# Patient Record
Sex: Female | Born: 1965 | Race: Black or African American | Hispanic: No | Marital: Married | Smoking: Never smoker
Health system: Southern US, Community
[De-identification: ages and names within clinical notes are randomized; demographics above are authoritative.]

## PROBLEM LIST (undated history)

## (undated) DIAGNOSIS — R718 Other abnormality of red blood cells: Secondary | ICD-10-CM

## (undated) DIAGNOSIS — D649 Anemia, unspecified: Secondary | ICD-10-CM

## (undated) DIAGNOSIS — R87619 Unspecified abnormal cytological findings in specimens from cervix uteri: Secondary | ICD-10-CM

## (undated) DIAGNOSIS — D500 Iron deficiency anemia secondary to blood loss (chronic): Secondary | ICD-10-CM

## (undated) DIAGNOSIS — D219 Benign neoplasm of connective and other soft tissue, unspecified: Secondary | ICD-10-CM

## (undated) DIAGNOSIS — Z5189 Encounter for other specified aftercare: Secondary | ICD-10-CM

## (undated) DIAGNOSIS — R011 Cardiac murmur, unspecified: Secondary | ICD-10-CM

## (undated) DIAGNOSIS — I10 Essential (primary) hypertension: Secondary | ICD-10-CM

## (undated) HISTORY — DX: Encounter for other specified aftercare: Z51.89

## (undated) HISTORY — PX: MYOMECTOMY: SHX85

## (undated) HISTORY — DX: Essential (primary) hypertension: I10

## (undated) HISTORY — DX: Benign neoplasm of connective and other soft tissue, unspecified: D21.9

## (undated) HISTORY — DX: Iron deficiency anemia secondary to blood loss (chronic): D50.0

## (undated) HISTORY — DX: Anemia, unspecified: D64.9

## (undated) HISTORY — DX: Cardiac murmur, unspecified: R01.1

## (undated) HISTORY — DX: Other abnormality of red blood cells: R71.8

## (undated) HISTORY — DX: Unspecified abnormal cytological findings in specimens from cervix uteri: R87.619

---

## 2003-04-19 ENCOUNTER — Other Ambulatory Visit: Admission: RE | Admit: 2003-04-19 | Discharge: 2003-04-19 | Payer: Self-pay | Admitting: Family Medicine

## 2003-05-03 ENCOUNTER — Encounter: Payer: Self-pay | Admitting: Family Medicine

## 2003-05-03 ENCOUNTER — Encounter: Admission: RE | Admit: 2003-05-03 | Discharge: 2003-05-03 | Payer: Self-pay | Admitting: Family Medicine

## 2003-05-21 ENCOUNTER — Encounter (INDEPENDENT_AMBULATORY_CARE_PROVIDER_SITE_OTHER): Payer: Self-pay | Admitting: *Deleted

## 2003-05-21 ENCOUNTER — Ambulatory Visit (HOSPITAL_COMMUNITY): Admission: RE | Admit: 2003-05-21 | Discharge: 2003-05-21 | Payer: Self-pay | Admitting: Gastroenterology

## 2005-06-10 ENCOUNTER — Ambulatory Visit (HOSPITAL_COMMUNITY): Admission: RE | Admit: 2005-06-10 | Discharge: 2005-06-10 | Payer: Self-pay | Admitting: Obstetrics & Gynecology

## 2005-12-17 ENCOUNTER — Encounter (INDEPENDENT_AMBULATORY_CARE_PROVIDER_SITE_OTHER): Payer: Self-pay | Admitting: *Deleted

## 2005-12-17 ENCOUNTER — Inpatient Hospital Stay (HOSPITAL_COMMUNITY): Admission: RE | Admit: 2005-12-17 | Discharge: 2005-12-20 | Payer: Self-pay | Admitting: Obstetrics & Gynecology

## 2007-07-13 ENCOUNTER — Encounter: Admission: RE | Admit: 2007-07-13 | Discharge: 2007-07-13 | Payer: Self-pay | Admitting: Internal Medicine

## 2008-01-25 ENCOUNTER — Encounter: Admission: RE | Admit: 2008-01-25 | Discharge: 2008-01-25 | Payer: Self-pay | Admitting: Obstetrics and Gynecology

## 2008-02-03 ENCOUNTER — Encounter: Admission: RE | Admit: 2008-02-03 | Discharge: 2008-02-03 | Payer: Self-pay | Admitting: Obstetrics and Gynecology

## 2010-09-11 ENCOUNTER — Ambulatory Visit: Payer: Self-pay | Admitting: Internal Medicine

## 2010-09-11 ENCOUNTER — Emergency Department (HOSPITAL_COMMUNITY): Admission: EM | Admit: 2010-09-11 | Discharge: 2010-09-11 | Payer: Self-pay | Admitting: Emergency Medicine

## 2011-01-04 ENCOUNTER — Encounter: Payer: Self-pay | Admitting: Obstetrics and Gynecology

## 2011-02-26 LAB — DIFFERENTIAL
Basophils Absolute: 0.1 10*3/uL (ref 0.0–0.1)
Basophils Relative: 1 % (ref 0–1)
Eosinophils Relative: 2 % (ref 0–5)
Lymphocytes Relative: 27 % (ref 12–46)
Lymphs Abs: 2.3 10*3/uL (ref 0.7–4.0)
Monocytes Relative: 8 % (ref 3–12)
Neutro Abs: 5.1 10*3/uL (ref 1.7–7.7)

## 2011-02-26 LAB — BASIC METABOLIC PANEL
CO2: 25 mEq/L (ref 19–32)
Chloride: 108 mEq/L (ref 96–112)
GFR calc Af Amer: >60 mL/min (ref >60)
Potassium: 3.4 mEq/L — ABNORMAL LOW (ref 3.5–5.1)

## 2011-02-26 LAB — IRON AND TIBC: UIBC: 376 ug/dL

## 2011-02-26 LAB — CBC
HCT: 24 % — ABNORMAL LOW (ref 36.0–46.0)
Hemoglobin: 6.4 g/dL — CL (ref 12.0–15.0)
MCH: 14.5 pg — ABNORMAL LOW (ref 26.0–34.0)
MCV: 54.3 fL — ABNORMAL LOW (ref 78.0–100.0)
RBC: 4.42 MIL/uL (ref 3.87–5.11)
WBC: 8.4 10*3/uL (ref 4.0–10.5)

## 2011-02-26 LAB — ABO/RH: ABO/RH(D): O POS

## 2011-02-26 LAB — TYPE AND SCREEN
ABO/RH(D): O POS
Antibody Screen: NEGATIVE

## 2011-02-26 LAB — FOLATE: Folate: >20 ng/mL

## 2011-02-26 LAB — POCT CARDIAC MARKERS: Troponin i, poc: <0.05 ng/mL (ref 0.00–0.09)

## 2011-03-17 ENCOUNTER — Other Ambulatory Visit: Payer: Self-pay | Admitting: Nurse Practitioner

## 2011-03-18 ENCOUNTER — Other Ambulatory Visit: Payer: Self-pay | Admitting: Obstetrics and Gynecology

## 2011-03-18 DIAGNOSIS — Z1231 Encounter for screening mammogram for malignant neoplasm of breast: Secondary | ICD-10-CM

## 2011-03-21 ENCOUNTER — Other Ambulatory Visit: Payer: Self-pay | Admitting: Nurse Practitioner

## 2011-03-30 ENCOUNTER — Ambulatory Visit: Payer: Self-pay

## 2011-04-22 ENCOUNTER — Ambulatory Visit
Admission: RE | Admit: 2011-04-22 | Discharge: 2011-04-22 | Disposition: A | Discharge status: Home / Self Care | Payer: Commercial Indemnity | Source: Ambulatory Visit | Attending: Obstetrics and Gynecology | Admitting: Obstetrics and Gynecology

## 2011-04-22 DIAGNOSIS — Z1231 Encounter for screening mammogram for malignant neoplasm of breast: Secondary | ICD-10-CM

## 2011-05-01 NOTE — Op Note (Signed)
NAME:  Beth Jimenez, Beth Jimenez                            ACCOUNT NO.:  [id number]   MEDICAL RECORD NO.:  [id number]                   PATIENT TYPE:  AMB   LOCATION:  ENDO                                 FACILITY:  [facility]   PHYSICIAN:  Anselmo Rod, M.D.               DATE OF BIRTH:  25-Mar-1966   DATE OF PROCEDURE:  05/21/2003  DATE OF DISCHARGE:                                 OPERATIVE REPORT   PROCEDURE:  Esophagogastroduodenoscopy with biopsies.   ENDOSCOPIST:  Anselmo Rod, M.D.   INSTRUMENT USED:  Olympus video panendoscope.   INDICATIONS FOR PROCEDURE:  A 45 year old [place] American female with a  history of  iron deficiency anemia and other diseases not related to  gastritis, etc.   PRE PROCEDURE PREPARATION:  Informed consent was obtained from the patient.  The patient was  fasted for 8 hours prior to  the procedure. Pre procedure  physical, the patient had stable vital signs, neck supple, chest clear to  auscultation, S1, S2, abdomen soft with normal bowel sounds.   DESCRIPTION OF PROCEDURE:  The patient was placed in the left lateral  decubitus position and sedated with 50 mg of Demerol, 5 mg of Versed  intravenously. Once the patient was adequately sedated and maintained on low  flow oxygen and continuous cardiac monitoring, the Olympus video  panendoscope was advanced through the mouth piece over the tongue into the  esophagus under direct visualization.   The entire esophagus appeared normal with no evidence of rings, stricture,  masses, esophagitis or Barrett's mucosa. The scope was then advanced into  the stomach. Many ulcerations were seen in the mid body of the stomach.  These were biopsied to rule out H. pylori by pathology.  The rest of the  gastric mucosa including  the high cardia and the antrum appeared  normal.  There was no outlet obstruction. The duodenal bulb and the proximal small  bowel and distal bowel appeared normal.   IMPRESSION:  1. Many  ulcerations in the mid body, all the stomach biopsied to rule out H.     pylori.  2. Normal appearing esophagus and proximal small bowel.   RECOMMENDATIONS:  1. Await pathology results.  2. Avoid all nonsteroidals including aspirin.  3. Proton pump inhibitor of choice (I will give the patient some samples     from the office).  4.     Treat with antibiotics if H. pylori is present.  5. Proceed with colonoscopy at this time.                                                Anselmo Rod, M.D.    JNM/MEDQ  D:  05/21/2003  T:  05/21/2003  Job:  [id number]  cc:   Talmadge Coventry, M.D.  [address], [address]  [place]  [place] [address]  Fax: [phone]

## 2011-05-01 NOTE — H&P (Signed)
Beth Jimenez, Beth Jimenez                  ACCOUNT NO.:  [id number]   MEDICAL RECORD NO.:  [id number]          PATIENT TYPE:  AMB   LOCATION:  SDC                           [facility]   PHYSICIAN:  Roseanna Rainbow, M.D.DATE OF BIRTH:  02/07/1966   DATE OF ADMISSION:  DATE OF DISCHARGE:                                HISTORY & PHYSICAL   CHIEF COMPLAINT:  The patient is a 45 year old African American female with  uterine fibroids who presents for multiple abdominal myomectomies.   HISTORY OF PRESENT ILLNESS:  The patient has a long history of menorrhagia.  She has a remote history of a vaginal hemorrhage requiring a blood  transfusion.  Workup to date has included a pelvic ultrasound performed, on  June 10, 2005, that demonstrated a uterus that was at least 13.3-cm in  sagittal diameter.  There were three discrete uterine fibroids.  The largest  one was in the right upper uterine body measuring 8-cm in diameter.  This  fibroid was also noted to have a large submucosal component.  The second  fibroid was in the posterior uterine body and was 4-cm in diameter and there  was a third located in the left uterine body that measured 4-cm.  There was  no evidence of hydronephrosis.  An initial hemoglobin was in the 10 range,  repeat hemoglobin in October was 12.4.  A Pap smear, on June 11, 2005, was  within normal limits.  The patient has been treated medically with Depo-  Provera.   ALLERGIES:  No known drug allergies.   PAST MEDICAL HISTORY:  Anemia, please see the above.   PAST SURGICAL HISTORY:  Please see below.   SOCIAL HISTORY:  She is married, living with her spouse.  She is employed in  collections.  She has no significant smoking history, does not give any  significant history of alcohol usage.  Denies illicit drug use.   FAMILY HISTORY:  Chronic hypertension.   PAST GYNECOLOGIC HISTORY:  Please see the above.  She is status post a  bilateral tubal ligation.   PAST  OBSTETRICAL HISTORY:  She has been pregnant four times.  She has three  living children.  There is a history of one miscarriage or abortion.   REVIEW OF SYSTEMS:  Noncontributory.   MEDICATIONS:  Repliva  .   PHYSICAL EXAMINATION:  GENERAL:  Well-developed, well-nourished, African  American female in no apparent distress.  LUNGS:  Clear to auscultation bilaterally.  HEART:  Regular rate and rhythm.  ABDOMEN:  Normoactive bowel sounds.  There is a nontender mass arising from  the pelvis to the level of the umbilicus.  PELVIC:  The external female genitalia normal-appearing.  On speculum exam,  the vagina is clean.  On bimanual exam, the uterus is markedly enlarged  approximately 18 weeks aggregate size.   ASSESSMENT:  1.  Symptomatic leiomyomata of the uterus with secondary menorrhagia.  2.  Anemia.   PLAN:  The planned procedures are multiple abdominal myomectomies.  The  risks, benefits, and alternative forms of management were reviewed with the  patient and informed consent has been obtained.      Roseanna Rainbow, M.D.  Electronically Signed     LAJ/MEDQ  D:  12/16/2005  T:  12/16/2005  Job:  [id number]

## 2011-05-01 NOTE — Op Note (Signed)
   NAME:  Beth Jimenez, Beth Jimenez                            ACCOUNT NO.:  [id number]   MEDICAL RECORD NO.:  [id number]                   PATIENT TYPE:  AMB   LOCATION:  ENDO                                 FACILITY:  [facility]   PHYSICIAN:  Anselmo Rod, M.D.               DATE OF BIRTH:  May 12, 1966   DATE OF PROCEDURE:  05/21/2003  DATE OF DISCHARGE:                                 OPERATIVE REPORT   PROCEDURE:  Colonoscopy.   ENDOSCOPIST:  Anselmo Rod, M.D.   INSTRUMENT USED:  Olympus video colonoscope.   INDICATIONS FOR PROCEDURE:  Iron deficiency anemia in a 45 year old African-  American female, rule out colonic polyps, masses, etc.   PREPROCEDURE PREPARATION:  Informed consent was procured from the patient.  The patient fasted for eight hours prior to the procedure and prepped with a  bottle of magnesium citrate and a gallon of GoLYTELY the night prior to the  procedure.   PREPROCEDURE PHYSICAL:  The patient had stable vital signs. Neck supple.  Chest clear to auscultation. S1, S2 regular. Abdomen soft with normal bowel  sounds.   DESCRIPTION OF PROCEDURE:  The patient was placed in the left lateral  decubitus position and sedated with Demerol and Versed for the EGD, no  additional sedation was used for the colonoscopy. Once the patient was  adequately sedated and maintained on low flow oxygen and continuous cardiac  monitoring, the Olympus video colonoscope was advanced from the rectum to  the cecum without difficulty. The patient had a fairly good prep.  The  appendiceal orifice and ileocecal valve were clearly visualized and  photographed. No masses, polyps, erosions, ulcerations or diverticula were  seen. Retroflexion in the rectum revealed no abnormalities.   IMPRESSION:  Normal colonoscopy, no masses or polyps seen.   RECOMMENDATIONS:  1. Continue serial CBCs.  2. Continue iron supplementation as advised by Wyline Copas P.A.  3. Outpatient followup in the next two  weeks.  4. Agree with GYN evaluation (Dr. Peggye Pitt) with regards to dysfunctional     uterine bleeding, question of uterine fibroids.                                               Anselmo Rod, M.D.    JNM/MEDQ  D:  05/21/2003  T:  05/21/2003  Job:  [id number]   cc:   Annmarie Mazzocchi M.D.

## 2011-05-01 NOTE — Discharge Summary (Signed)
Beth Jimenez, Beth Jimenez                  ACCOUNT NO.:  [id number]   MEDICAL RECORD NO.:  [id number]          PATIENT TYPE:  INP   LOCATION:  9315                          [facility]   PHYSICIAN:  Charles A. Clearance Coots, M.D.DATE OF BIRTH:  04-Oct-1966   DATE OF ADMISSION:  12/17/2005  DATE OF DISCHARGE:  12/20/2005                                 DISCHARGE SUMMARY   ADMISSION DIAGNOSES:  1.  Uterine fibroids, symptomatic, with secondary menorrhagia.  2.  Anemia.   DISCHARGE DIAGNOSES:  1.  Uterine fibroids, symptomatic, with secondary menorrhagia.  2.  Anemia, stable clinically with no significant orthostatic changes.  The      patient did not require transfusion.  Anemia was from increased blood      loss surgically.  3.  Status post abdominal myomectomy.   DISPOSITION:  Discharge home in good condition.   REASON FOR ADMISSION:  A 45 year old black female with uterine fibroids,  presents for abdominal myomectomies.  The patient has a long history of  menorrhagia. She has a remote history of vaginal hemorrhage requiring a  blood transfusion.  Work-up to date has included a pelvic ultrasound  performed on June 10, 2005 that demonstrated a uterus that was at least 13  cm in sagittal diameter.  There were three discrete uterine fibroids.  The  largest one in the right upper uterine body measuring 8 cm in diameter.  Fibroid also noted to have large submucosal component.  A second fibroid was  in the posterior uterine body.  It was 4 cm in diameter and there was a  third located in the left uterine body measuring 4 cm.  An initial  hemoglobin was in the 10 range and repeat hemoglobin in October was 12.4.  Pap smear on June 11, 2005 was within normal limits.  The patient has been  treated medically with Depo-Provera to suppress menstrual periods.   PAST SURGICAL HISTORY:  Tubal ligation.   PAST MEDICAL HISTORY:  Anemia.   MEDICATIONS:  Iron.   ALLERGIES:  No known drug allergies.   SOCIAL HISTORY:  Married.  Living with spouse.  Employed in collections.  Has no significant smoking history and does not give any history of alcohol  usage or recreational drug use.   FAMILY HISTORY:  Significant for chronic hypertension.   OB HISTORY:  Gravida 4, para 3.   PHYSICAL EXAMINATION:  GENERAL:  Well-nourished, well-developed black female  in no acute distress.  VITAL SIGNS: Afebrile.  Vital signs are stable.  HEENT:  Normal.  LUNGS:  Clear to auscultation bilaterally.  HEART:  Regular rate and rhythm.  ABDOMEN:  Tender mass arising from the pelvis to the level of the umbilicus.  PELVIC:  Normal external female genitalia.  Vaginal mucosa clean.  Bimanual  exam:  The uterus is markedly enlarged, approximately 18 weeks in aggregate  size.   ADMITTING LABORATORY DATA:  Hemoglobin 11, hematocrit 33.9, white blood cell  count 6400, platelets 408,000.  Coags were within normal limits.  Comprehensive metabolic panel was within normal limits.  Urinalysis was  within normal  limits.   HOSPITAL COURSE:  The patient underwent abdominal myomectomies on December 17, 2005. There were no intraoperative complications.  Postoperative course was  uncomplicated.  The patient was discharged home on postoperative day #3 in  good condition.   DISCHARGE LABORATORY DATA:  Hemoglobin 6.9, hematocrit 21.5, white blood  cell count 9700, platelets 325,000.   DISCHARGE MEDICATIONS:  1.  Ibuprofen and Tylox were prescribed for pain.  2.  Continue iron therapy for anemia.   DISCHARGE INSTRUCTIONS:  Routine written instructions were given for  surgical discharge.  The patient is to call the office for followup  appointment in two weeks.      Charles A. Clearance Coots, M.D.  Electronically Signed     CAH/MEDQ  D:  12/20/2005  T:  12/21/2005  Job:  [id number]

## 2011-05-01 NOTE — Op Note (Signed)
Beth Jimenez, Beth Jimenez                  ACCOUNT NO.:  [id number]   MEDICAL RECORD NO.:  [id number]          PATIENT TYPE:  INP   LOCATION:  9399                          [facility]   PHYSICIAN:  Roseanna Rainbow, M.D.DATE OF BIRTH:  July 13, 1966   DATE OF PROCEDURE:  12/17/2005  DATE OF DISCHARGE:                                 OPERATIVE REPORT   PREOPERATIVE DIAGNOSIS:  Uterine fibroids.   POSTOPERATIVE DIAGNOSIS:  Uterine fibroids.   PROCEDURE:  Multiple abdominal myomectomy.   SURGEONS:  Roseanna Rainbow, M.D., Bing Neighbors. Clearance Coots, M.D.   ANESTHESIA:  General.   PATHOLOGY SPECIMENS:  Multiple myomas.   ESTIMATED BLOOD LOSS:  400 mL.   COMPLICATIONS:  None.   IV FLUID AND URINE OUTPUT:  As per anesthesiology.   PROCEDURE:  The patient was taken to the operating room with an IV running.  She was placed in the dorsal lithotomy position and prepped and draped in  the usual sterile fashion.  A Pfannenstiel skin incision was then made with  the scalpel and carried down to the underlying fascia with the Bovie.  The  fascia was nicked in the midline.  The fascial incision was then extended  bilaterally with curved Mayo scissors.  The superior aspect of the fascial  incision was tented up with Kocher clamps and the underlying rectus muscles  dissected off.  The inferior aspect of the fascial incision was manipulated  in a similar fashion.  The rectus muscles were separated in the midline.  The parietal peritoneum was tented up and entered.  The peritoneal incision  was then extended superiorly and inferiorly with good visualization of the  bladder.  The uterus was then exteriorized.  The myometrium in the midline  extending from the fundus to the lower uterine segment was infiltrated with  a dilute Pitressin solution.  The serosa and myometrium were incised down to  the underlying fibroids using the Bovie.  The fibroids were then enucleated.  This incision was then  extended inferiorly and to the left anteriorly to  facilitate the removal of an anterior myoma on the left.  Again the fibroid  was enucleated.  The endometrial cavity was entered.  Four myomas were  removed.  The defects in the myometrium were then reapproximated in layers  two running sutures, interlocking sutures of 2-0 Monocryl.  The serosa was  reapproximated in a running fashion using 2-0 Monocryl.  Adequate hemostasis  was noted.  The pelvis was copiously irrigated.  The uterus was returned to  the abdomen.  Seprafilm was placed along the serosal suture line.  The  parietal peritoneum was reapproximated in a running fashion with 2-0 Vicryl.  The  fascia was reapproximated in a running fashion using 0 PDS.  The skin was  reapproximated with staples.  At the close of the procedure, the instrument  and pack counts were said to be correct x2.  The patient was taken to the  PACU awake and in stable condition.      Roseanna Rainbow, M.D.  Electronically Signed  LAJ/MEDQ  D:  12/17/2005  T:  12/17/2005  Job:  [id number]

## 2011-08-21 IMAGING — CR DG CHEST 1V PORT
1 series · 1 of 1 positions shown · non-contrast
Comparison: None.

CLINICAL DATA: Chest pain.

PORTABLE CHEST - 1 VIEW

[view not recorded]
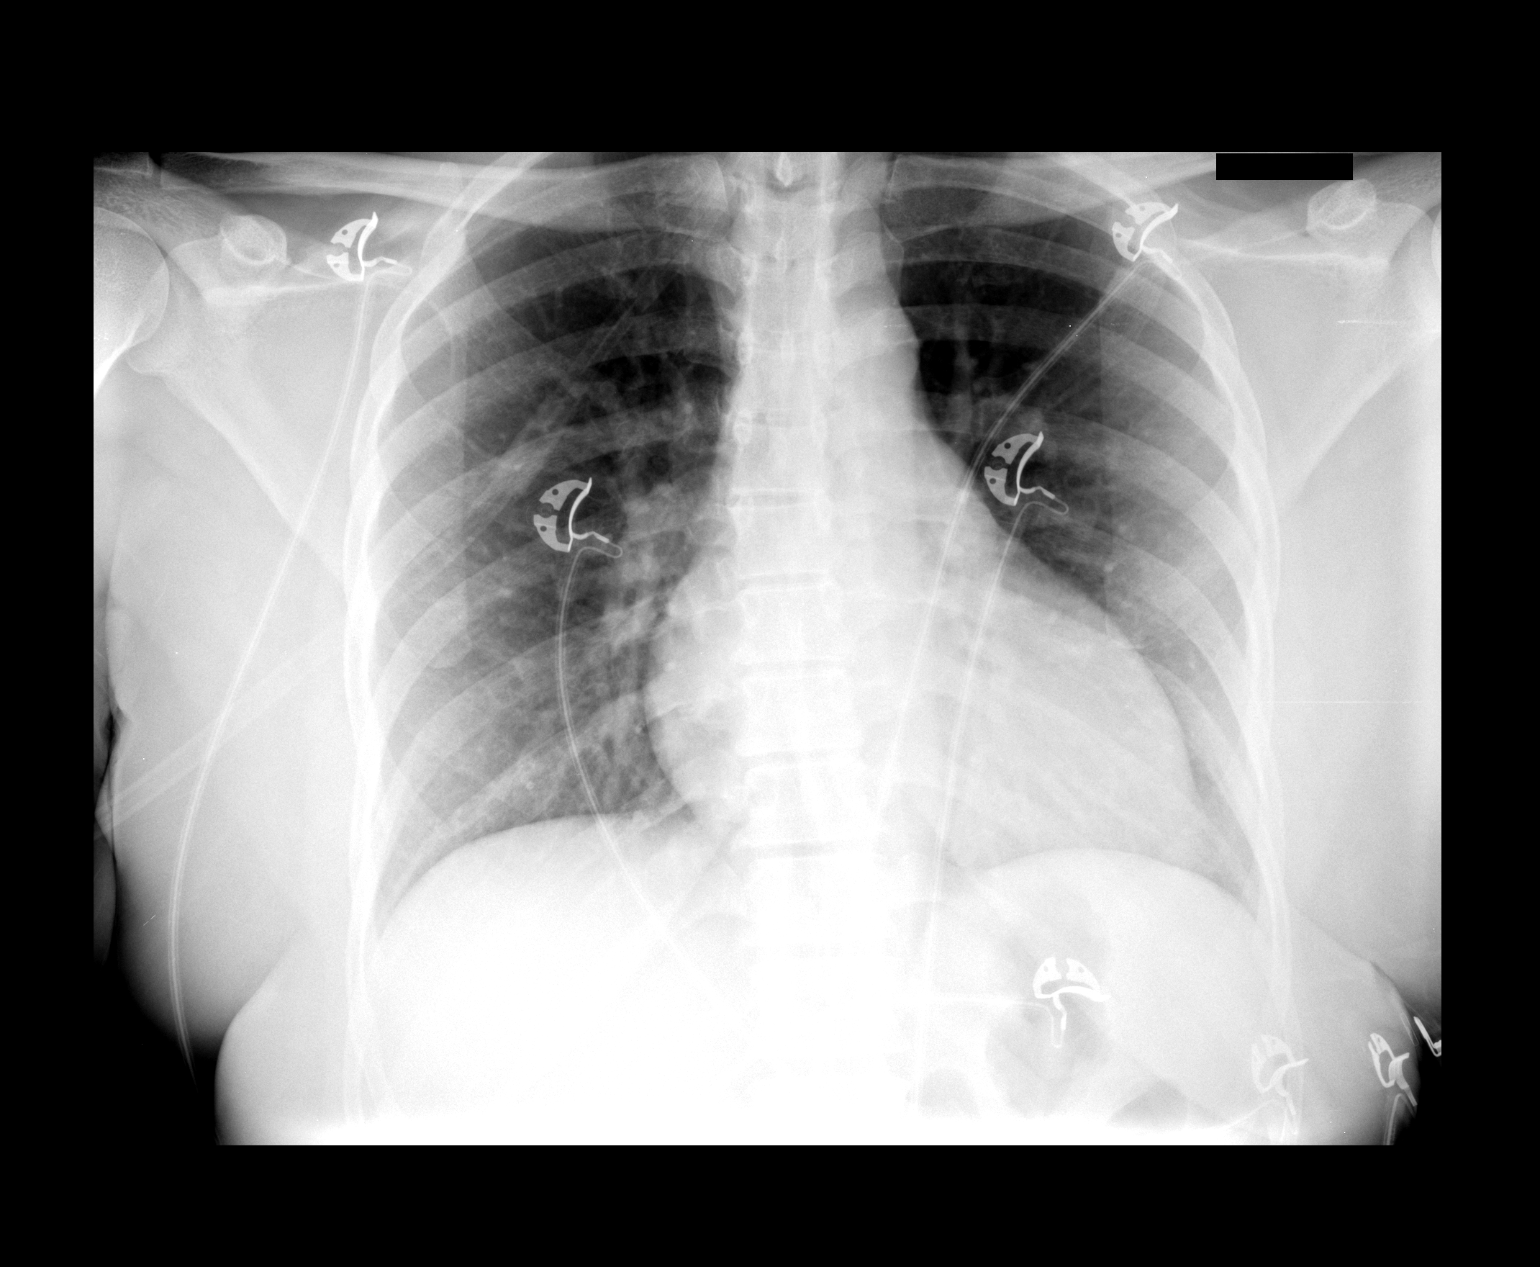

[1 of 1 positions shown; findings below may reference images not displayed]

FINDINGS: The lungs are clear.  Heart size is normal.  No pleural
effusion or focal bony abnormality.
IMPRESSION: No acute disease.

## 2012-04-25 ENCOUNTER — Other Ambulatory Visit: Payer: Self-pay | Admitting: Internal Medicine

## 2012-05-04 ENCOUNTER — Other Ambulatory Visit: Payer: Self-pay | Admitting: Internal Medicine

## 2012-05-25 ENCOUNTER — Other Ambulatory Visit: Payer: Self-pay | Admitting: Family Medicine

## 2012-05-25 DIAGNOSIS — Z1231 Encounter for screening mammogram for malignant neoplasm of breast: Secondary | ICD-10-CM

## 2012-06-06 ENCOUNTER — Ambulatory Visit
Admission: RE | Admit: 2012-06-06 | Discharge: 2012-06-06 | Disposition: A | Discharge status: Home / Self Care | Payer: Commercial Indemnity | Source: Ambulatory Visit | Attending: Family Medicine | Admitting: Family Medicine

## 2012-06-06 DIAGNOSIS — Z1231 Encounter for screening mammogram for malignant neoplasm of breast: Secondary | ICD-10-CM

## 2012-06-24 ENCOUNTER — Other Ambulatory Visit (HOSPITAL_COMMUNITY): Payer: Self-pay | Admitting: *Deleted

## 2012-06-27 ENCOUNTER — Encounter (HOSPITAL_COMMUNITY)
Admission: RE | Admit: 2012-06-27 | Discharge: 2012-06-27 | Disposition: A | Discharge status: Home / Self Care | Payer: Commercial Indemnity | Source: Ambulatory Visit | Attending: Family Medicine | Admitting: Family Medicine

## 2012-06-27 DIAGNOSIS — N920 Excessive and frequent menstruation with regular cycle: Secondary | ICD-10-CM | POA: Insufficient documentation

## 2012-06-27 DIAGNOSIS — D259 Leiomyoma of uterus, unspecified: Secondary | ICD-10-CM | POA: Insufficient documentation

## 2012-06-27 DIAGNOSIS — D62 Acute posthemorrhagic anemia: Secondary | ICD-10-CM | POA: Insufficient documentation

## 2012-06-27 MED ORDER — DIPHENHYDRAMINE HCL 50 MG/ML IJ SOLN
25.0000 mg | Freq: Once | INTRAMUSCULAR | Status: AC
  Administered 2012-06-27: 25 mg via INTRAVENOUS
  Filled 2012-06-27: qty 1

## 2012-06-27 MED ORDER — FUROSEMIDE 10 MG/ML IJ SOLN
20.0000 mg | Freq: Once | INTRAMUSCULAR | Status: AC
  Administered 2012-06-27: 20 mg via INTRAVENOUS
  Filled 2012-06-27 (×3): qty 2

## 2012-06-28 LAB — TYPE AND SCREEN
Antibody Screen: NEGATIVE
Unit division: 0

## 2012-12-19 ENCOUNTER — Other Ambulatory Visit: Payer: Self-pay | Admitting: Internal Medicine

## 2012-12-19 ENCOUNTER — Other Ambulatory Visit: Payer: Self-pay | Admitting: Family Medicine

## 2012-12-19 DIAGNOSIS — N6310 Unspecified lump in the right breast, unspecified quadrant: Secondary | ICD-10-CM

## 2012-12-23 ENCOUNTER — Ambulatory Visit
Admission: RE | Admit: 2012-12-23 | Discharge: 2012-12-23 | Disposition: A | Discharge status: Home / Self Care | Payer: Commercial Indemnity | Source: Ambulatory Visit | Attending: Family Medicine | Admitting: Family Medicine

## 2012-12-23 DIAGNOSIS — N6310 Unspecified lump in the right breast, unspecified quadrant: Secondary | ICD-10-CM

## 2013-06-29 ENCOUNTER — Other Ambulatory Visit: Payer: Self-pay

## 2013-06-29 DIAGNOSIS — Z1231 Encounter for screening mammogram for malignant neoplasm of breast: Secondary | ICD-10-CM

## 2013-07-24 ENCOUNTER — Ambulatory Visit: Payer: Commercial Indemnity

## 2013-09-28 ENCOUNTER — Other Ambulatory Visit: Payer: Self-pay

## 2013-09-28 DIAGNOSIS — Z1231 Encounter for screening mammogram for malignant neoplasm of breast: Secondary | ICD-10-CM

## 2013-10-03 ENCOUNTER — Encounter (HOSPITAL_COMMUNITY): Payer: Commercial Indemnity

## 2013-10-04 ENCOUNTER — Telehealth: Payer: Self-pay | Admitting: Oncology

## 2013-10-04 NOTE — Telephone Encounter (Signed)
LVOM FOR PT TO RETURN CALL IN RE TO REFERRAL.  °

## 2013-10-09 ENCOUNTER — Ambulatory Visit: Payer: Commercial Indemnity

## 2013-10-24 ENCOUNTER — Ambulatory Visit
Admission: RE | Admit: 2013-10-24 | Discharge: 2013-10-24 | Disposition: A | Discharge status: Home / Self Care | Payer: Managed Care, Other (non HMO) | Source: Ambulatory Visit

## 2013-10-24 DIAGNOSIS — Z1231 Encounter for screening mammogram for malignant neoplasm of breast: Secondary | ICD-10-CM

## 2014-06-27 ENCOUNTER — Telehealth: Payer: Self-pay | Admitting: Hematology and Oncology

## 2014-06-27 NOTE — Telephone Encounter (Signed)
LEFT  MESSAGE AND GAVE NP APPT FOR 07/21 @ 1:30 W/DR. [place].  REFERRING DR. [place] DX- ANEMIA

## 2014-06-27 NOTE — Telephone Encounter (Signed)
PATIENT CALLED TO CONFIRMED APPT.

## 2014-07-03 ENCOUNTER — Ambulatory Visit: Payer: Managed Care, Other (non HMO)

## 2014-07-03 ENCOUNTER — Encounter: Payer: Self-pay | Admitting: Hematology and Oncology

## 2014-07-03 ENCOUNTER — Encounter (INDEPENDENT_AMBULATORY_CARE_PROVIDER_SITE_OTHER): Payer: Self-pay

## 2014-07-03 ENCOUNTER — Ambulatory Visit (HOSPITAL_BASED_OUTPATIENT_CLINIC_OR_DEPARTMENT_OTHER): Payer: Managed Care, Other (non HMO) | Admitting: Hematology and Oncology

## 2014-07-03 ENCOUNTER — Telehealth: Payer: Self-pay | Admitting: Hematology and Oncology

## 2014-07-03 VITALS — BP 139/81 | HR 90 | Temp 98.6°F | Resp 18 | Ht 65.0 in | Wt 178.3 lb

## 2014-07-03 DIAGNOSIS — N920 Excessive and frequent menstruation with regular cycle: Secondary | ICD-10-CM | POA: Insufficient documentation

## 2014-07-03 DIAGNOSIS — D500 Iron deficiency anemia secondary to blood loss (chronic): Secondary | ICD-10-CM

## 2014-07-03 DIAGNOSIS — R799 Abnormal finding of blood chemistry, unspecified: Secondary | ICD-10-CM

## 2014-07-03 DIAGNOSIS — N921 Excessive and frequent menstruation with irregular cycle: Secondary | ICD-10-CM

## 2014-07-03 HISTORY — DX: Iron deficiency anemia secondary to blood loss (chronic): D50.0

## 2014-07-03 NOTE — Assessment & Plan Note (Signed)
The most likely cause of her anemia is due to chronic blood loss from menorrhagia. We discussed some of the risks, benefits, and alternatives of intravenous iron infusions. The patient is symptomatic from anemia and the iron level is critically low. She tolerated oral iron supplement poorly and desires to achieved higher levels of iron faster for adequate hematopoesis. Some of the side-effects to be expected including risks of infusion reactions, phlebitis, headaches, nausea and fatigue.  The patient is willing to proceed. Patient education material was dispensed.  Goal is to keep ferritin level greater than 50 I recommend high-dose folic acid supplementation. I will recheck her blood in a month and see her back. The patient is aware she may need a second dose of intravenous iron infusion.

## 2014-07-03 NOTE — Progress Notes (Signed)
Checked in new patient with no financial issues prior to seeing the dr. She has appt card and [place] been out of the country. °

## 2014-07-03 NOTE — Progress Notes (Signed)
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 female is here because of severe iron deficiency anemia.  She was found to have abnormal CBC from blood work. Her CBC from 09/28/2013 show hemoglobin of 7.8, MCV of 69.7. On 06/25/2014, hemoglobin was 8.5, MCV 66.3 and platelet count of 538. In 2011, she developed severe menorrhagia requiring blood transfusions. She has received blood transfusion twice, last transfusion was a year ago. She have chronic, severe menorrhagia with irregular menstruation over the last 5 months. She has been having menstrual cycles twice a month, average length of penetration 8-9 days with heavy periods over the first few days. She routinely misses work due to heavy menstruation. Hysterectomy iss planned. She had uterine fibroids status post myomectomy and history of abnormal Pap smears in the past. She takes one oral iron supplement daily after breakfast. She denies pica.  She denies recent chest pain on exertion, shortness of breath on minimal exertion, pre-syncopal episodes, or palpitations. She had not noticed any recent bleeding such as epistaxis, hematuria or hematochezia The patient denies over the counter NSAID ingestion. She is not on antiplatelets agents.  She had no prior history or diagnosis of cancer. Her age appropriate screening programs are up-to-date. She denies any pica and eats a variety of diet. She never donated blood. MEDICAL HISTORY:  Past Medical History  Diagnosis Date  . Blood transfusion without reported diagnosis   . Anemia   . Hypertension   . Fibroids   . Abnormal Pap smear of cervix   . Iron deficiency  anemia secondary to blood loss (chronic) 07/03/2014    SURGICAL HISTORY: Past Surgical History  Procedure Laterality Date  . Myomectomy      SOCIAL HISTORY: History   Social History  . Marital Status: Married    Spouse Name: N/A    Number of Children: N/A  . Years of Education: N/A   Occupational History  . Not on file.   Social History Main Topics  . Smoking status: Never Smoker   . Smokeless tobacco: Never Used  . Alcohol Use: No  . Drug Use: No  . Sexual Activity: Not on file   Other Topics Concern  . Not on file   Social History Narrative  . No narrative on file    FAMILY HISTORY: Family History  Problem Relation Age of Onset  . Cancer Mother     lung ca  . Cancer Maternal Grandmother     lung ca    ALLERGIES:  has no allergies on file.  MEDICATIONS:  Current Outpatient Prescriptions  Medication Sig Dispense Refill  . amLODipine (NORVASC) 10 MG tablet TAKE ONE TABLET BY MOUTH EVERY DAY  30 tablet  3  . IRON, FERROUS GLUCONATE, PO Take by mouth daily.      . Multiple Vitamin (MULTIVITAMIN) tablet Take 1 tablet by mouth daily.      Marland Kitchen OVER THE COUNTER MEDICATION Take by mouth. BURN- weight loss pill. Takes 1 tablet at breakfast and 1 at lunch.      Marland Kitchen OVER THE COUNTER MEDICATION Take by mouth. Takes a Purifier tablet in the eveing.       No current facility-administered medications for this visit.    REVIEW  OF SYSTEMS:   Constitutional: Denies fevers, chills or abnormal night sweats Eyes: Denies blurriness of vision, double vision or watery eyes Ears, nose, mouth, throat, and face: Denies mucositis or sore throat Respiratory: Denies cough, dyspnea or wheezes Cardiovascular: Denies palpitation, chest discomfort or lower extremity swelling Gastrointestinal:  Denies nausea, heartburn or change in bowel habits Skin: Denies abnormal skin rashes Lymphatics: Denies new lymphadenopathy or easy bruising Neurological:Denies numbness, tingling or new  weaknesses Behavioral/Psych: Mood is stable, no new changes  All other systems were reviewed with the patient and are negative.  PHYSICAL EXAMINATION: ECOG PERFORMANCE STATUS: 1 - Symptomatic but completely ambulatory  Filed Vitals:   07/03/14 1402  BP: 139/81  Pulse: 90  Temp: 98.6 F (37 C)  Resp: 18   Filed Weights   07/03/14 1402  Weight: 178 lb 4.8 oz (80.876 kg)    GENERAL:alert, no distress and comfortable. She is mildly obese SKIN: skin color, texture, turgor are normal, no rashes or significant lesions EYES: normal, conjunctiva are pale and non-injected, sclera clear OROPHARYNX:no exudate, no erythema and lips, buccal mucosa, and tongue normal  NECK: supple, thyroid normal size, non-tender, without nodularity LYMPH:  no palpable lymphadenopathy in the cervical, axillary or inguinal LUNGS: clear to auscultation and percussion with normal breathing effort HEART: regular rate & rhythm and no murmurs and no lower extremity edema ABDOMEN:abdomen soft, non-tender and normal bowel sounds Musculoskeletal:no cyanosis of digits and no clubbing  PSYCH: alert & oriented x 3 with fluent speech NEURO: no focal motor/sensory deficits  LABORATORY DATA:  I have reviewed the data as listed  ASSESSMENT & PLAN:  Iron deficiency anemia secondary to blood loss (chronic) The most likely cause of her anemia is due to chronic blood loss from menorrhagia. We discussed some of the risks, benefits, and alternatives of intravenous iron infusions. The patient is symptomatic from anemia and the iron level is critically low. She tolerated oral iron supplement poorly and desires to achieved higher levels of iron faster for adequate hematopoesis. Some of the side-effects to be expected including risks of infusion reactions, phlebitis, headaches, nausea and fatigue.  The patient is willing to proceed. Patient education material was dispensed.  Goal is to keep ferritin level greater than 50 I recommend  high-dose folic acid supplementation. I will recheck her blood in a month and see her back. The patient is aware she may need a second dose of intravenous iron infusion.  Menorrhagia This is the cause of her chronic blood loss. Hysterectomy is planned in the near future but we need to correct her anemia first. Her anemia should resolve within the next month.   Her high platelet count is due to reactive thrombocytosis. I will monitor for now and he would likely resolve with iron infusion.   All questions were answered. The patient knows to call the clinic with any problems, questions or concerns. I spent 40 minutes counseling the patient face to face. The total time spent in the appointment was 55 minutes and more than 50% was on counseling.     [facility], [place], MD 07/03/2014 2:27 PM

## 2014-07-03 NOTE — Telephone Encounter (Signed)
gv and printed appt sched and avs for pt for July and Aug.. Added tx.

## 2014-07-03 NOTE — Assessment & Plan Note (Signed)
This is the cause of her chronic blood loss. Hysterectomy is planned in the near future but we need to correct her anemia first. Her anemia should resolve within the next month.

## 2014-07-04 ENCOUNTER — Ambulatory Visit: Payer: Managed Care, Other (non HMO)

## 2014-07-09 ENCOUNTER — Ambulatory Visit (HOSPITAL_BASED_OUTPATIENT_CLINIC_OR_DEPARTMENT_OTHER): Payer: Managed Care, Other (non HMO)

## 2014-07-09 VITALS — BP 123/68 | HR 87 | Temp 97.6°F | Resp 18

## 2014-07-09 DIAGNOSIS — N920 Excessive and frequent menstruation with regular cycle: Secondary | ICD-10-CM

## 2014-07-09 DIAGNOSIS — N921 Excessive and frequent menstruation with irregular cycle: Secondary | ICD-10-CM

## 2014-07-09 DIAGNOSIS — D500 Iron deficiency anemia secondary to blood loss (chronic): Secondary | ICD-10-CM

## 2014-07-09 MED ORDER — SODIUM CHLORIDE 0.9 % IV SOLN
Freq: Once | INTRAVENOUS | Status: AC
  Administered 2014-07-09: 09:00:00 via INTRAVENOUS

## 2014-07-09 MED ORDER — SODIUM CHLORIDE 0.9 % IV SOLN
1020.0000 mg | Freq: Once | INTRAVENOUS | Status: AC
  Administered 2014-07-09: 1020 mg via INTRAVENOUS
  Filled 2014-07-09: qty 34

## 2014-07-09 NOTE — Patient Instructions (Signed)

## 2014-07-12 ENCOUNTER — Telehealth: Payer: Self-pay | Admitting: *Deleted

## 2014-07-12 MED ORDER — PREDNISONE 20 MG PO TABS: 20.0000 mg | ORAL_TABLET | Freq: Every day | ORAL | Status: DC

## 2014-07-12 NOTE — Telephone Encounter (Signed)
Tell her to take 25 mg bendaryl TID PO and call in prednisone 20 mg daily X 7 days

## 2014-07-12 NOTE — Telephone Encounter (Signed)
Pt reports the hives are all over her arms, hands, legs and back.  Instructed her to take Benadryl 25 mg TID and Prednisone 20 mg daily per Dr. Alvy Bimler.  Rx for Prednisone sent to [facility] and instructed pt to take with food.  Start today as soon as possible.  Please call us back if symptoms worsen.  She verbalized understanding.

## 2014-07-12 NOTE — Telephone Encounter (Signed)
Pt left VM states woke up this morning w/ hives and itching.  She had Feraheme on Monday 7/27.  Called pt back to get more information and left VM for pt to return nurse's call.

## 2014-07-30 ENCOUNTER — Telehealth: Payer: Self-pay | Admitting: Hematology and Oncology

## 2014-07-30 ENCOUNTER — Other Ambulatory Visit: Payer: Managed Care, Other (non HMO)

## 2014-07-30 ENCOUNTER — Telehealth: Payer: Self-pay | Admitting: *Deleted

## 2014-07-30 NOTE — Telephone Encounter (Signed)
returned pt call and r/s missed appt...pt ok adn aware

## 2014-07-30 NOTE — Telephone Encounter (Signed)
Pt called earlier because she missed her lab appt this morning.  She has since gotten it r/s for tomorrow morning.  Pt reports a very heavy menstrual cycle and asks if this is normal?  Informed pt this RN has heard this happens after iron infusion but to discuss w/ Dr. Alvy Bimler on her visit later this week.  Pt verbalized understanding.

## 2014-07-31 ENCOUNTER — Other Ambulatory Visit (HOSPITAL_BASED_OUTPATIENT_CLINIC_OR_DEPARTMENT_OTHER): Payer: Managed Care, Other (non HMO)

## 2014-07-31 DIAGNOSIS — D500 Iron deficiency anemia secondary to blood loss (chronic): Secondary | ICD-10-CM

## 2014-07-31 LAB — CBC & DIFF AND RETIC
BASO%: 1.2 % (ref 0.0–2.0)
Basophils Absolute: 0.1 10*3/uL (ref 0.0–0.1)
EOS%: 3.7 % (ref 0.0–7.0)
Eosinophils Absolute: 0.2 10*3/uL (ref 0.0–0.5)
HCT: 33 % — ABNORMAL LOW (ref 34.8–46.6)
HEMOGLOBIN: 10.1 g/dL — AB (ref 11.6–15.9)
IMMATURE RETIC FRACT: 1.5 % — AB (ref 1.60–10.00)
LYMPH#: 1.7 10*3/uL (ref 0.9–3.3)
LYMPH%: 38.3 % (ref 14.0–49.7)
MCH: 21.6 pg — AB (ref 25.1–34.0)
MCHC: 30.6 g/dL — AB (ref 31.5–36.0)
MCV: 70.5 fL — AB (ref 79.5–101.0)
MONO#: 0.3 10*3/uL (ref 0.1–0.9)
MONO%: 7.6 % (ref 0.0–14.0)
NEUT#: 2.1 10*3/uL (ref 1.5–6.5)
NEUT%: 49.2 % (ref 38.4–76.8)
Platelets: 355 10*3/uL (ref 145–400)
RBC: 4.68 10*6/uL (ref 3.70–5.45)
RDW: 22.5 % — AB (ref 11.2–14.5)
RETIC %: 1.11 % (ref 0.70–2.10)
RETIC CT ABS: 51.95 10*3/uL (ref 33.70–90.70)
WBC: 4.3 10*3/uL (ref 3.9–10.3)

## 2014-07-31 LAB — IRON AND TIBC CHCC
%SAT: 21 % (ref 21–57)
Iron: 50 ug/dL (ref 41–142)
TIBC: 244 ug/dL (ref 236–444)
UIBC: 193 ug/dL (ref 120–384)

## 2014-07-31 LAB — TECHNOLOGIST REVIEW

## 2014-07-31 LAB — FERRITIN CHCC: Ferritin: 233 ng/ml (ref 9–269)

## 2014-08-03 ENCOUNTER — Ambulatory Visit (HOSPITAL_BASED_OUTPATIENT_CLINIC_OR_DEPARTMENT_OTHER): Payer: Managed Care, Other (non HMO) | Admitting: Hematology and Oncology

## 2014-08-03 ENCOUNTER — Telehealth: Payer: Self-pay | Admitting: Hematology and Oncology

## 2014-08-03 ENCOUNTER — Encounter: Payer: Self-pay | Admitting: Hematology and Oncology

## 2014-08-03 ENCOUNTER — Ambulatory Visit (HOSPITAL_BASED_OUTPATIENT_CLINIC_OR_DEPARTMENT_OTHER): Payer: Managed Care, Other (non HMO)

## 2014-08-03 VITALS — BP 129/79 | HR 83 | Temp 98.4°F | Resp 20 | Ht 65.0 in | Wt 173.9 lb

## 2014-08-03 DIAGNOSIS — R718 Other abnormality of red blood cells: Secondary | ICD-10-CM

## 2014-08-03 DIAGNOSIS — N921 Excessive and frequent menstruation with irregular cycle: Secondary | ICD-10-CM

## 2014-08-03 DIAGNOSIS — D500 Iron deficiency anemia secondary to blood loss (chronic): Secondary | ICD-10-CM

## 2014-08-03 DIAGNOSIS — N920 Excessive and frequent menstruation with regular cycle: Secondary | ICD-10-CM

## 2014-08-03 HISTORY — DX: Other abnormality of red blood cells: R71.8

## 2014-08-03 LAB — CBC WITH DIFFERENTIAL/PLATELET
BASO%: 1.8 % (ref 0.0–2.0)
BASOS ABS: 0.1 10*3/uL (ref 0.0–0.1)
EOS%: 2.4 % (ref 0.0–7.0)
Eosinophils Absolute: 0.1 10*3/uL (ref 0.0–0.5)
HCT: 30.1 % — ABNORMAL LOW (ref 34.8–46.6)
HEMOGLOBIN: 9.1 g/dL — AB (ref 11.6–15.9)
LYMPH%: 27.1 % (ref 14.0–49.7)
MCH: 21.1 pg — AB (ref 25.1–34.0)
MCHC: 30.1 g/dL — AB (ref 31.5–36.0)
MCV: 70.1 fL — AB (ref 79.5–101.0)
MONO#: 0.4 10*3/uL (ref 0.1–0.9)
MONO%: 7 % (ref 0.0–14.0)
NEUT#: 3.3 10*3/uL (ref 1.5–6.5)
NEUT%: 61.7 % (ref 38.4–76.8)
Platelets: 411 10*3/uL — ABNORMAL HIGH (ref 145–400)
RBC: 4.3 10*6/uL (ref 3.70–5.45)
RDW: 24.6 % — AB (ref 11.2–14.5)
WBC: 5.4 10*3/uL (ref 3.9–10.3)
lymph#: 1.5 10*3/uL (ref 0.9–3.3)

## 2014-08-03 NOTE — Progress Notes (Signed)
[place] OFFICE PROGRESS NOTE  Beth Coma, MD SUMMARY OF HEMATOLOGIC HISTORY:  She was found to have abnormal CBC from blood work. Her CBC from 09/28/2013 show hemoglobin of 7.8, MCV of 69.7. On 06/25/2014, hemoglobin was 8.5, MCV 66.3 and platelet count of 538. In 2011, she developed severe menorrhagia requiring blood transfusions. She has received blood transfusion twice, last transfusion was a year ago. She have chronic, severe menorrhagia with irregular menstruation over the last 5 months. She has been having menstrual cycles twice a month, average length of penetration 8-9 days with heavy periods over the first few days. She routinely misses work due to heavy menstruation. Hysterectomy iss planned. She had uterine fibroids status post myomectomy and history of abnormal Pap smears in the past. She takes one oral iron supplement daily after breakfast. She denies pica. On 07/09/2014, she was given intravenous iron feraheme INTERVAL HISTORY: [name] 48 y.o. female returns for further followup. She has improved energy level. She denies recent bleeding.  I have reviewed the past medical history, past surgical history, social history and family history with the patient and they are unchanged from previous note.  ALLERGIES:  is allergic to feraheme.  MEDICATIONS:  Current Outpatient Prescriptions  Medication Sig Dispense Refill  . amLODipine (NORVASC) 10 MG tablet TAKE ONE TABLET BY MOUTH EVERY DAY  30 tablet  3  . folic acid (FOLVITE) 607 MCG tablet Take 400 mcg by mouth 2 (two) times daily.      . Multiple Vitamin (MULTIVITAMIN) tablet Take 1 tablet by mouth daily.      Marland Kitchen OVER THE COUNTER MEDICATION Take by mouth. BURN- weight loss pill. Takes 1 tablet at breakfast and 1 at lunch.      Marland Kitchen OVER THE COUNTER MEDICATION Take by mouth. Takes a Purifier tablet in the eveing.      . predniSONE (DELTASONE) 20 MG tablet Take 1 tablet (20 mg total) by mouth daily.  7  tablet  0   No current facility-administered medications for this visit.     REVIEW OF SYSTEMS:   Constitutional: Denies fevers, chills or night sweats Eyes: Denies blurriness of vision Ears, nose, mouth, throat, and face: Denies mucositis or sore throat Respiratory: Denies cough, dyspnea or wheezes Cardiovascular: Denies palpitation, chest discomfort or lower extremity swelling Gastrointestinal:  Denies nausea, heartburn or change in bowel habits Skin: Denies abnormal skin rashes Lymphatics: Denies new lymphadenopathy or easy bruising Neurological:Denies numbness, tingling or new weaknesses Behavioral/Psych: Mood is stable, no new changes  All other systems were reviewed with the patient and are negative.  PHYSICAL EXAMINATION: ECOG PERFORMANCE STATUS: 0 - Asymptomatic  Filed Vitals:   08/03/14 1108  BP: 129/79  Pulse: 83  Temp: 98.4 F (36.9 C)  Resp: 20   Filed Weights   08/03/14 1108  Weight: 173 lb 14.4 oz (78.881 kg)    GENERAL:alert, no distress and comfortable SKIN: skin color, texture, turgor are normal, no rashes or significant lesions EYES: normal, Conjunctiva are pink and non-injected, sclera clear OROPHARYNX:no exudate, no erythema and lips, buccal mucosa, and tongue normal  NECK: supple, thyroid normal size, non-tender, without nodularity LYMPH:  no palpable lymphadenopathy in the cervical, axillary or inguinal LUNGS: clear to auscultation and percussion with normal breathing effort HEART: regular rate & rhythm and no murmurs and no lower extremity edema ABDOMEN:abdomen soft, non-tender and normal bowel sounds Musculoskeletal:no cyanosis of digits and no clubbing  NEURO: alert & oriented x 3 with fluent speech, no  focal motor/sensory deficits  LABORATORY DATA:  I have reviewed the data as listed No results found for this or any previous visit (from the past 48 hour(s)).  Lab Results  Component Value Date   WBC 5.4 08/03/2014   HGB 9.1* 08/03/2014   HCT  30.1* 08/03/2014   MCV 70.1* 08/03/2014   PLT 411* 08/03/2014   ASSESSMENT & PLAN:  Iron deficiency anemia secondary to blood loss (chronic) She is no longer iron deficient but she remains anemic. I will order an additional workup for this.  Menorrhagia She is awaiting hysterectomy but her gynecologist required clearance from me. I will call her gynecologist once I have find a result of hemoglobinopathy evaluation  Microcytosis I suspect she may have cause anemia. I recommend hemoglobinopathy evaluation because I felt that this would impact her future followup and her family.    All questions were answered. The patient knows to call the clinic with any problems, questions or concerns. No barriers to learning was detected.  I spent 15 minutes counseling the patient face to face. The total time spent in the appointment was 20 minutes and more than 50% was on counseling.     [facility], [place], MD 08/03/2014 12:13 PM

## 2014-08-03 NOTE — Assessment & Plan Note (Signed)
She is no longer iron deficient but she remains anemic. I will order an additional workup for this.

## 2014-08-03 NOTE — Assessment & Plan Note (Signed)
She is awaiting hysterectomy but her gynecologist required clearance from me. I will call her gynecologist once I have find a result of hemoglobinopathy evaluation

## 2014-08-03 NOTE — Telephone Encounter (Signed)
gv and printed pt avs and sent to lab

## 2014-08-03 NOTE — Assessment & Plan Note (Signed)
I suspect she may have cause anemia. I recommend hemoglobinopathy evaluation because I felt that this would impact her future followup and her family.

## 2014-08-07 LAB — HEMOGLOBINOPATHY EVALUATION
HEMOGLOBIN OTHER: 0 %
HGB A2 QUANT: 4.9 % — AB (ref 2.2–3.2)
HGB A: 93.1 % — AB (ref 96.8–97.8)
HGB F QUANT: 2 % (ref 0.0–2.0)
HGB S QUANTITAION: 0 %

## 2014-08-08 ENCOUNTER — Telehealth: Payer: Self-pay | Admitting: Hematology and Oncology

## 2014-08-08 ENCOUNTER — Other Ambulatory Visit: Payer: Self-pay | Admitting: Hematology and Oncology

## 2014-08-08 NOTE — Telephone Encounter (Signed)
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 I will speak with the gynecologist about proceeding with surgery as the patient will always remain anemic.

## 2014-10-24 ENCOUNTER — Other Ambulatory Visit: Payer: Self-pay | Admitting: Hematology and Oncology

## 2014-11-12 ENCOUNTER — Encounter (HOSPITAL_COMMUNITY): Payer: Self-pay

## 2014-11-12 ENCOUNTER — Encounter (HOSPITAL_COMMUNITY)
Admission: RE | Admit: 2014-11-12 | Discharge: 2014-11-12 | Disposition: A | Discharge status: Home / Self Care | Payer: Managed Care, Other (non HMO) | Source: Ambulatory Visit | Attending: Obstetrics and Gynecology | Admitting: Obstetrics and Gynecology

## 2014-11-12 ENCOUNTER — Other Ambulatory Visit: Payer: Self-pay

## 2014-11-12 DIAGNOSIS — N920 Excessive and frequent menstruation with regular cycle: Secondary | ICD-10-CM | POA: Diagnosis present

## 2014-11-12 DIAGNOSIS — I10 Essential (primary) hypertension: Secondary | ICD-10-CM | POA: Diagnosis not present

## 2014-11-12 DIAGNOSIS — Z91018 Allergy to other foods: Secondary | ICD-10-CM | POA: Diagnosis not present

## 2014-11-12 DIAGNOSIS — D259 Leiomyoma of uterus, unspecified: Secondary | ICD-10-CM | POA: Diagnosis not present

## 2014-11-12 DIAGNOSIS — D649 Anemia, unspecified: Secondary | ICD-10-CM | POA: Diagnosis not present

## 2014-11-12 DIAGNOSIS — Z91048 Other nonmedicinal substance allergy status: Secondary | ICD-10-CM | POA: Diagnosis not present

## 2014-11-12 DIAGNOSIS — R102 Pelvic and perineal pain: Secondary | ICD-10-CM | POA: Diagnosis not present

## 2014-11-12 LAB — CBC
HCT: 26.7 % — ABNORMAL LOW (ref 36.0–46.0)
Hemoglobin: 8 g/dL — ABNORMAL LOW (ref 12.0–15.0)
MCH: 18.3 pg — AB (ref 26.0–34.0)
MCHC: 30 g/dL (ref 30.0–36.0)
MCV: 61 fL — ABNORMAL LOW (ref 78.0–100.0)
PLATELETS: 505 10*3/uL — AB (ref 150–400)
RBC: 4.38 MIL/uL (ref 3.87–5.11)
RDW: 19.8 % — ABNORMAL HIGH (ref 11.5–15.5)
WBC: 6.1 10*3/uL (ref 4.0–10.5)

## 2014-11-12 LAB — BASIC METABOLIC PANEL
ANION GAP: 13 (ref 5–15)
BUN: 10 mg/dL (ref 6–23)
CALCIUM: 8.7 mg/dL (ref 8.4–10.5)
CO2: 23 meq/L (ref 19–32)
Chloride: 102 mEq/L (ref 96–112)
Creatinine, Ser: 0.98 mg/dL (ref 0.50–1.10)
GFR calc Af Amer: 77 mL/min — ABNORMAL LOW (ref >90)
GFR, EST NON AFRICAN AMERICAN: 67 mL/min — AB (ref >90)
Glucose, Bld: 114 mg/dL — ABNORMAL HIGH (ref 70–99)
POTASSIUM: 3.7 meq/L (ref 3.7–5.3)
SODIUM: 138 meq/L (ref 137–147)

## 2014-11-12 NOTE — Patient Instructions (Signed)
Your procedure is scheduled on:11/15/14  Enter through the Main Entrance at :6am Pick up desk phone and dial [phone] and inform us of your arrival.  Please call [phone] if you have any problems the morning of surgery.  Remember: Do not eat food or drink liquids, including water, after midnight:WED   You may brush your teeth the morning of surgery.  Take these meds the morning of surgery with a sip of water:BP pill  DO NOT wear jewelry, eye make-up, lipstick,body lotion, or dark fingernail polish.  (Polished toes are ok) You may wear deodorant.  If you are to be admitted after surgery, leave suitcase in car until your room has been assigned. Patients discharged on the day of surgery will not be allowed to drive home. Wear loose fitting, comfortable clothes for your ride home.

## 2014-11-12 NOTE — Pre-Procedure Instructions (Signed)
PAT lab result of hgb 8.0 reported to Dr. Royce Macadamia- no surgeon orders in [facility]. Dr. Royce Macadamia ordered T&S on day of surgery.

## 2014-11-13 ENCOUNTER — Ambulatory Visit (HOSPITAL_COMMUNITY)
Admission: AD | Admit: 2014-11-13 | Discharge: 2014-11-13 | Disposition: A | Discharge status: Home / Self Care | Payer: Managed Care, Other (non HMO) | Source: Ambulatory Visit | Attending: Obstetrics and Gynecology | Admitting: Obstetrics and Gynecology

## 2014-11-13 LAB — ABO/RH: ABO/RH(D): O POS

## 2014-11-14 DIAGNOSIS — D259 Leiomyoma of uterus, unspecified: Secondary | ICD-10-CM | POA: Diagnosis present

## 2014-11-14 DIAGNOSIS — R102 Pelvic and perineal pain: Secondary | ICD-10-CM | POA: Diagnosis present

## 2014-11-14 NOTE — H&P (Signed)
Beth Jimenez is an 48 y.o. female  [phone] with known uterine fibroids, pelvic pain, menorrhagia.  At last appt pt's Hgb stable at 100.  At pre-op appointment, pt's hemoglobin - 8, has T&S, have d/w her chance of transfusion.  Pt with fibroid likely impinging on endometrium.  Pt has h/o myomectomy.  D/W pt r/b/a of LAVH, voices understanding, wishes to proceed.  D/W pt enlarged, fibroid uterus, chance of having to convert to Abdominal hysterectomy.    Pertinent Gynecological History: Menses: menorrhagia and irregular, painful menses Bleeding: heavy painful Contraception: tubal ligation Blood transfusions: none Sexually transmitted diseases: no past history Previous GYN Procedures: TAB, myomectomy, cervical polypectomy  Last mammogram: normal  Last pap: normal Date: 6/15 office records no abn pap OB History: G4, P3013, SVD x 3  Menstrual History: No LMP recorded.    Past Medical History  Diagnosis Date  . Blood transfusion without reported diagnosis   . Hypertension   . Fibroids   . Abnormal Pap smear of cervix   . Microcytosis 08/03/2014  . Anemia   . Iron deficiency anemia secondary to blood loss (chronic) 07/03/2014    Past Surgical History  Procedure Laterality Date  . Myomectomy    TAB, BTL  Family History  Problem Relation Age of Onset  . Cancer Mother     lung ca  . Cancer Maternal Grandmother     lung ca  Down;s syndrome - sister Mother HTN  Social History:  reports that she has never smoked. She has never used smokeless tobacco. She reports that she does not drink alcohol or use illicit drugs.remarried, accounting and collections  Allergies:  Allergies  Allergen Reactions  . Cinnamon Swelling  . Peppermint Oil Swelling  . Feraheme [Ferumoxytol] Hives    No prescriptions prior to admission    Review of Systems  Constitutional: Negative.   HENT: Negative.   Eyes: Negative.   Respiratory: Negative.   Cardiovascular: Negative.   Gastrointestinal:  Negative.   Genitourinary:       Pelvic pain  Musculoskeletal: Negative.   Skin: Negative.   Neurological: Negative.   Psychiatric/Behavioral: Negative.     There were no vitals taken for this visit. Physical Exam  Constitutional: She is oriented to person, place, and time. She appears well-developed and well-nourished.  HENT:  Head: Normocephalic and atraumatic.  Cardiovascular: Normal rate and regular rhythm.   Respiratory: Effort normal and breath sounds normal. No respiratory distress. She has no wheezes.  GI: Soft. Bowel sounds are normal. She exhibits no distension. There is no tenderness.  Musculoskeletal: Normal range of motion.  Neurological: She is alert and oriented to person, place, and time.  Skin: Skin is warm and dry.  Psychiatric: She has a normal mood and affect. Her behavior is normal.    No results found for this or any previous visit (from the past 24 hour(s)).  No results found.  [place] multifibroids, most intramural 1cm-3cm, may impinge on endometrium, nl ovaries  Assessment/Plan: [id number] with fibroid uterus, pelvic pain and menorrhagia for LAVH Ancef for prophylaxis Poss need to convert to TAH Anemic - poss need for transfusion  Beth Jimenez 11/14/2014, 9:26 PM

## 2014-11-15 ENCOUNTER — Ambulatory Visit (HOSPITAL_COMMUNITY): Payer: Managed Care, Other (non HMO) | Admitting: Anesthesiology

## 2014-11-15 ENCOUNTER — Encounter (HOSPITAL_COMMUNITY): Payer: Self-pay | Admitting: Anesthesiology

## 2014-11-15 ENCOUNTER — Encounter (HOSPITAL_COMMUNITY)
Admission: RE | Disposition: A | Discharge status: Home / Self Care | Payer: Self-pay | Source: Ambulatory Visit | Attending: Obstetrics and Gynecology

## 2014-11-15 ENCOUNTER — Observation Stay (HOSPITAL_COMMUNITY)
Admission: RE | Admit: 2014-11-15 | Discharge: 2014-11-16 | Disposition: A | Discharge status: Home / Self Care | Payer: Managed Care, Other (non HMO) | Source: Ambulatory Visit | Attending: Obstetrics and Gynecology | Admitting: Obstetrics and Gynecology

## 2014-11-15 DIAGNOSIS — N920 Excessive and frequent menstruation with regular cycle: Secondary | ICD-10-CM

## 2014-11-15 DIAGNOSIS — D250 Submucous leiomyoma of uterus: Secondary | ICD-10-CM

## 2014-11-15 DIAGNOSIS — R102 Pelvic and perineal pain unspecified side: Secondary | ICD-10-CM

## 2014-11-15 DIAGNOSIS — D259 Leiomyoma of uterus, unspecified: Principal | ICD-10-CM | POA: Diagnosis present

## 2014-11-15 DIAGNOSIS — Z90710 Acquired absence of both cervix and uterus: Secondary | ICD-10-CM | POA: Diagnosis present

## 2014-11-15 DIAGNOSIS — Z91048 Other nonmedicinal substance allergy status: Secondary | ICD-10-CM | POA: Insufficient documentation

## 2014-11-15 DIAGNOSIS — Z91018 Allergy to other foods: Secondary | ICD-10-CM | POA: Insufficient documentation

## 2014-11-15 DIAGNOSIS — I10 Essential (primary) hypertension: Secondary | ICD-10-CM | POA: Insufficient documentation

## 2014-11-15 DIAGNOSIS — D649 Anemia, unspecified: Secondary | ICD-10-CM | POA: Insufficient documentation

## 2014-11-15 HISTORY — PX: LAPAROSCOPIC ASSISTED VAGINAL HYSTERECTOMY: SHX5398

## 2014-11-15 LAB — CBC
HCT: 20.6 % — ABNORMAL LOW (ref 36.0–46.0)
HEMATOCRIT: 28.1 % — AB (ref 36.0–46.0)
HEMOGLOBIN: 6.2 g/dL — AB (ref 12.0–15.0)
Hemoglobin: 9 g/dL — ABNORMAL LOW (ref 12.0–15.0)
MCH: 18.2 pg — ABNORMAL LOW (ref 26.0–34.0)
MCH: 21.6 pg — AB (ref 26.0–34.0)
MCHC: 30.1 g/dL (ref 30.0–36.0)
MCHC: 32 g/dL (ref 30.0–36.0)
MCV: 60.4 fL — ABNORMAL LOW (ref 78.0–100.0)
MCV: 67.4 fL — ABNORMAL LOW (ref 78.0–100.0)
Platelets: 428 10*3/uL — ABNORMAL HIGH (ref 150–400)
Platelets: 411 10*3/uL — ABNORMAL HIGH (ref 150–400)
RBC: 3.41 MIL/uL — AB (ref 3.87–5.11)
RBC: 4.17 MIL/uL (ref 3.87–5.11)
RDW: 20.2 % — ABNORMAL HIGH (ref 11.5–15.5)
RDW: 25.5 % — ABNORMAL HIGH (ref 11.5–15.5)
WBC: 7.2 10*3/uL (ref 4.0–10.5)
WBC: 16.8 10*3/uL — ABNORMAL HIGH (ref 4.0–10.5)

## 2014-11-15 LAB — PREPARE RBC (CROSSMATCH)

## 2014-11-15 LAB — PREGNANCY, URINE: Preg Test, Ur: NEGATIVE

## 2014-11-15 SURGERY — HYSTERECTOMY, VAGINAL, LAPAROSCOPY-ASSISTED
Anesthesia: General | Site: Abdomen

## 2014-11-15 MED ORDER — ONDANSETRON HCL 4 MG/2ML IJ SOLN: 4.0000 mg | Freq: Four times a day (QID) | INTRAMUSCULAR | Status: DC | PRN

## 2014-11-15 MED ORDER — SIMETHICONE 80 MG PO CHEW
80.0000 mg | CHEWABLE_TABLET | Freq: Four times a day (QID) | ORAL | Status: DC | PRN
  Filled 2014-11-15: qty 1

## 2014-11-15 MED ORDER — MIDAZOLAM HCL 2 MG/2ML IJ SOLN
INTRAMUSCULAR | Status: AC
  Filled 2014-11-15: qty 2

## 2014-11-15 MED ORDER — EPHEDRINE 5 MG/ML INJ
INTRAVENOUS | Status: AC
  Filled 2014-11-15: qty 10

## 2014-11-15 MED ORDER — MENTHOL 3 MG MT LOZG
1.0000 | LOZENGE | OROMUCOSAL | Status: DC | PRN
  Administered 2014-11-15: 3 mg via ORAL
  Filled 2014-11-15 (×2): qty 9

## 2014-11-15 MED ORDER — METOCLOPRAMIDE HCL 5 MG/ML IJ SOLN: 10.0000 mg | Freq: Once | INTRAMUSCULAR | Status: DC | PRN

## 2014-11-15 MED ORDER — GLYCOPYRROLATE 0.2 MG/ML IJ SOLN
INTRAMUSCULAR | Status: AC
  Filled 2014-11-15: qty 1

## 2014-11-15 MED ORDER — IBUPROFEN 800 MG PO TABS
800.0000 mg | ORAL_TABLET | Freq: Three times a day (TID) | ORAL | Status: DC | PRN
  Administered 2014-11-16 (×2): 800 mg via ORAL
  Filled 2014-11-15 (×2): qty 1

## 2014-11-15 MED ORDER — ONDANSETRON HCL 4 MG/2ML IJ SOLN
INTRAMUSCULAR | Status: DC | PRN
  Administered 2014-11-15: 4 mg via INTRAVENOUS

## 2014-11-15 MED ORDER — ROCURONIUM BROMIDE 100 MG/10ML IV SOLN
INTRAVENOUS | Status: DC | PRN
  Administered 2014-11-15: 10 mg via INTRAVENOUS
  Administered 2014-11-15: 40 mg via INTRAVENOUS
  Administered 2014-11-15: 10 mg via INTRAVENOUS

## 2014-11-15 MED ORDER — LACTATED RINGERS IV SOLN
INTRAVENOUS | Status: DC
  Administered 2014-11-15: 07:00:00 via INTRAVENOUS

## 2014-11-15 MED ORDER — SODIUM CHLORIDE 0.9 % IV SOLN
INTRAVENOUS | Status: DC | PRN
  Administered 2014-11-15: 08:00:00 via INTRAVENOUS

## 2014-11-15 MED ORDER — INFLUENZA VAC SPLIT QUAD 0.5 ML IM SUSY
0.5000 mL | PREFILLED_SYRINGE | INTRAMUSCULAR | Status: AC
  Administered 2014-11-16: 0.5 mL via INTRAMUSCULAR
  Filled 2014-11-15: qty 0.5

## 2014-11-15 MED ORDER — LACTATED RINGERS IV SOLN
INTRAVENOUS | Status: DC
  Administered 2014-11-15: 19:00:00 via INTRAVENOUS

## 2014-11-15 MED ORDER — DEXAMETHASONE SODIUM PHOSPHATE 4 MG/ML IJ SOLN
INTRAMUSCULAR | Status: AC
  Filled 2014-11-15: qty 1

## 2014-11-15 MED ORDER — HYDROMORPHONE HCL 1 MG/ML IJ SOLN
0.2500 mg | INTRAMUSCULAR | Status: DC | PRN
  Administered 2014-11-15 (×4): 0.5 mg via INTRAVENOUS

## 2014-11-15 MED ORDER — DEXAMETHASONE SODIUM PHOSPHATE 10 MG/ML IJ SOLN
INTRAMUSCULAR | Status: DC | PRN
  Administered 2014-11-15: 4 mg via INTRAVENOUS

## 2014-11-15 MED ORDER — SCOPOLAMINE 1 MG/3DAYS TD PT72
1.0000 | MEDICATED_PATCH | TRANSDERMAL | Status: DC
  Administered 2014-11-15: 1.5 mg via TRANSDERMAL

## 2014-11-15 MED ORDER — CEFAZOLIN SODIUM-DEXTROSE 2-3 GM-% IV SOLR
2.0000 g | INTRAVENOUS | Status: AC
Start: 2014-11-15 — End: 2014-11-15
  Administered 2014-11-15: 2 g via INTRAVENOUS

## 2014-11-15 MED ORDER — PHENYLEPHRINE 40 MCG/ML (10ML) SYRINGE FOR IV PUSH (FOR BLOOD PRESSURE SUPPORT)
PREFILLED_SYRINGE | INTRAVENOUS | Status: AC
  Filled 2014-11-15: qty 5

## 2014-11-15 MED ORDER — MEPERIDINE HCL 25 MG/ML IJ SOLN: 6.2500 mg | INTRAMUSCULAR | Status: DC | PRN

## 2014-11-15 MED ORDER — HYDROMORPHONE HCL 1 MG/ML IJ SOLN
INTRAMUSCULAR | Status: AC
  Administered 2014-11-15: 0.5 mg via INTRAVENOUS
  Filled 2014-11-15: qty 1

## 2014-11-15 MED ORDER — FENTANYL CITRATE 0.05 MG/ML IJ SOLN
INTRAMUSCULAR | Status: AC
  Filled 2014-11-15: qty 5

## 2014-11-15 MED ORDER — PHENYLEPHRINE HCL 10 MG/ML IJ SOLN
INTRAMUSCULAR | Status: DC | PRN
  Administered 2014-11-15 (×2): 100 ug via INTRAVENOUS
  Administered 2014-11-15: 80 ug via INTRAVENOUS

## 2014-11-15 MED ORDER — LACTATED RINGERS IR SOLN
Status: DC | PRN
  Administered 2014-11-15: 3000 mL

## 2014-11-15 MED ORDER — MORPHINE SULFATE (PF) 1 MG/ML IV SOLN
INTRAVENOUS | Status: DC
  Administered 2014-11-15: 13:00:00 via INTRAVENOUS
  Administered 2014-11-15: 8 mg via INTRAVENOUS
  Administered 2014-11-15: 3 mg via INTRAVENOUS
  Filled 2014-11-15: qty 25

## 2014-11-15 MED ORDER — LIDOCAINE HCL (CARDIAC) 20 MG/ML IV SOLN
INTRAVENOUS | Status: DC | PRN
  Administered 2014-11-15: 50 mg via INTRAVENOUS

## 2014-11-15 MED ORDER — NEOSTIGMINE METHYLSULFATE 10 MG/10ML IV SOLN
INTRAVENOUS | Status: AC
  Filled 2014-11-15: qty 1

## 2014-11-15 MED ORDER — OXYCODONE-ACETAMINOPHEN 5-325 MG PO TABS
1.0000 | ORAL_TABLET | ORAL | Status: DC | PRN
  Administered 2014-11-15 – 2014-11-16 (×3): 1 via ORAL
  Administered 2014-11-16: 2 via ORAL
  Filled 2014-11-15 (×3): qty 1
  Filled 2014-11-15: qty 2

## 2014-11-15 MED ORDER — ALUM & MAG HYDROXIDE-SIMETH 200-200-20 MG/5ML PO SUSP
30.0000 mL | ORAL | Status: DC | PRN
  Filled 2014-11-15: qty 30

## 2014-11-15 MED ORDER — FENTANYL CITRATE 0.05 MG/ML IJ SOLN
INTRAMUSCULAR | Status: DC | PRN
  Administered 2014-11-15 (×2): 100 ug via INTRAVENOUS
  Administered 2014-11-15 (×3): 50 ug via INTRAVENOUS

## 2014-11-15 MED ORDER — KETOROLAC TROMETHAMINE 30 MG/ML IJ SOLN
INTRAMUSCULAR | Status: DC | PRN
  Administered 2014-11-15: 30 mg via INTRAVENOUS

## 2014-11-15 MED ORDER — NEOSTIGMINE METHYLSULFATE 10 MG/10ML IV SOLN
INTRAVENOUS | Status: DC | PRN
Start: 2014-11-15 — End: 2014-11-15
  Administered 2014-11-15: 4 mg via INTRAVENOUS

## 2014-11-15 MED ORDER — 0.9 % SODIUM CHLORIDE (POUR BTL) OPTIME
TOPICAL | Status: DC | PRN
  Administered 2014-11-15: 1000 mL

## 2014-11-15 MED ORDER — PHENYLEPHRINE HCL 10 MG/ML IJ SOLN
INTRAMUSCULAR | Status: AC
  Filled 2014-11-15: qty 1

## 2014-11-15 MED ORDER — SCOPOLAMINE 1 MG/3DAYS TD PT72: 1.0000 | MEDICATED_PATCH | Freq: Once | TRANSDERMAL | Status: DC

## 2014-11-15 MED ORDER — STERILE WATER FOR IRRIGATION IR SOLN
Status: DC | PRN
  Administered 2014-11-15: 1000 mL via INTRAVESICAL

## 2014-11-15 MED ORDER — LIDOCAINE HCL (CARDIAC) 20 MG/ML IV SOLN
INTRAVENOUS | Status: AC
Start: 2014-11-15 — End: 2014-11-15
  Filled 2014-11-15: qty 5

## 2014-11-15 MED ORDER — ONDANSETRON HCL 4 MG/2ML IJ SOLN
INTRAMUSCULAR | Status: AC
  Filled 2014-11-15: qty 2

## 2014-11-15 MED ORDER — SCOPOLAMINE 1 MG/3DAYS TD PT72
MEDICATED_PATCH | TRANSDERMAL | Status: AC
  Filled 2014-11-15: qty 1

## 2014-11-15 MED ORDER — PROPOFOL 10 MG/ML IV EMUL
INTRAVENOUS | Status: AC
  Filled 2014-11-15: qty 20

## 2014-11-15 MED ORDER — KETOROLAC TROMETHAMINE 30 MG/ML IJ SOLN
INTRAMUSCULAR | Status: AC
  Filled 2014-11-15: qty 1

## 2014-11-15 MED ORDER — SODIUM CHLORIDE 0.9 % IJ SOLN: 9.0000 mL | INTRAMUSCULAR | Status: DC | PRN

## 2014-11-15 MED ORDER — NALOXONE HCL 0.4 MG/ML IJ SOLN: 0.4000 mg | INTRAMUSCULAR | Status: DC | PRN

## 2014-11-15 MED ORDER — PROPOFOL 10 MG/ML IV BOLUS
INTRAVENOUS | Status: DC | PRN
  Administered 2014-11-15: 180 mg via INTRAVENOUS

## 2014-11-15 MED ORDER — LACTATED RINGERS IV SOLN: INTRAVENOUS | Status: DC

## 2014-11-15 MED ORDER — GLYCOPYRROLATE 0.2 MG/ML IJ SOLN
INTRAMUSCULAR | Status: DC | PRN
  Administered 2014-11-15: 0.2 mg via INTRAVENOUS

## 2014-11-15 MED ORDER — ONDANSETRON HCL 4 MG PO TABS: 4.0000 mg | ORAL_TABLET | Freq: Four times a day (QID) | ORAL | Status: DC | PRN

## 2014-11-15 MED ORDER — PHENYLEPHRINE HCL 10 MG/ML IJ SOLN
20.0000 mg | INTRAVENOUS | Status: DC | PRN
  Administered 2014-11-15: 25 ug/min via INTRAVENOUS

## 2014-11-15 MED ORDER — CEFAZOLIN SODIUM-DEXTROSE 2-3 GM-% IV SOLR
INTRAVENOUS | Status: AC
  Filled 2014-11-15: qty 50

## 2014-11-15 MED ORDER — MIDAZOLAM HCL 2 MG/2ML IJ SOLN
INTRAMUSCULAR | Status: DC | PRN
  Administered 2014-11-15: 2 mg via INTRAVENOUS

## 2014-11-15 MED ORDER — ONDANSETRON HCL 4 MG/2ML IJ SOLN: INTRAMUSCULAR | Status: DC | PRN

## 2014-11-15 MED ORDER — LACTATED RINGERS IV SOLN
INTRAVENOUS | Status: DC | PRN
  Administered 2014-11-15 (×2): via INTRAVENOUS

## 2014-11-15 MED ORDER — GUAIFENESIN 100 MG/5ML PO SOLN
15.0000 mL | ORAL | Status: DC | PRN
  Filled 2014-11-15: qty 15

## 2014-11-15 MED ORDER — ROCURONIUM BROMIDE 100 MG/10ML IV SOLN
INTRAVENOUS | Status: AC
  Filled 2014-11-15: qty 1

## 2014-11-15 MED ORDER — BUPIVACAINE HCL (PF) 0.25 % IJ SOLN
INTRAMUSCULAR | Status: AC
  Filled 2014-11-15: qty 30

## 2014-11-15 MED ORDER — GLYCOPYRROLATE 0.2 MG/ML IJ SOLN
INTRAMUSCULAR | Status: AC
  Filled 2014-11-15: qty 2

## 2014-11-15 MED ORDER — DIPHENHYDRAMINE HCL 12.5 MG/5ML PO ELIX: 12.5000 mg | ORAL_SOLUTION | Freq: Four times a day (QID) | ORAL | Status: DC | PRN

## 2014-11-15 MED ORDER — BUPIVACAINE HCL (PF) 0.25 % IJ SOLN
INTRAMUSCULAR | Status: DC | PRN
  Administered 2014-11-15: 14 mL

## 2014-11-15 MED ORDER — DIPHENHYDRAMINE HCL 50 MG/ML IJ SOLN: 12.5000 mg | Freq: Four times a day (QID) | INTRAMUSCULAR | Status: DC | PRN

## 2014-11-15 SURGICAL SUPPLY — 43 items
BLADE SURG 10 STRL SS (BLADE) ×3 IMPLANT
BLADE SURG 11 STRL SS (BLADE) ×6 IMPLANT
CABLE HIGH FREQUENCY MONO STRZ (ELECTRODE) IMPLANT
CLOSURE WOUND 1/4 X3 (GAUZE/BANDAGES/DRESSINGS)
CLOTH BEACON ORANGE TIMEOUT ST (SAFETY) ×3 IMPLANT
CONT PATH 16OZ SNAP LID 3702 (MISCELLANEOUS) ×3 IMPLANT
COVER BACK TABLE 60X90IN (DRAPES) ×3 IMPLANT
DECANTER SPIKE VIAL GLASS SM (MISCELLANEOUS) IMPLANT
DRSG COVADERM PLUS 2X2 (GAUZE/BANDAGES/DRESSINGS) ×6 IMPLANT
DRSG OPSITE POSTOP 3X4 (GAUZE/BANDAGES/DRESSINGS) ×3 IMPLANT
DURAPREP 26ML APPLICATOR (WOUND CARE) ×3 IMPLANT
ELECT REM PT RETURN 9FT ADLT (ELECTROSURGICAL) ×3
ELECTRODE REM PT RTRN 9FT ADLT (ELECTROSURGICAL) ×1 IMPLANT
EVACUATOR PREFILTER SMOKE (MISCELLANEOUS) ×3 IMPLANT
GLOVE BIO SURGEON STRL SZ 6.5 (GLOVE) ×2 IMPLANT
GLOVE BIO SURGEONS STRL SZ 6.5 (GLOVE) ×1
GLOVE BIOGEL PI IND STRL 7.0 (GLOVE) ×2 IMPLANT
GLOVE BIOGEL PI INDICATOR 7.0 (GLOVE) ×4
GOWN STRL REUS W/ TWL LRG LVL3 (GOWN DISPOSABLE) ×7 IMPLANT
GOWN STRL REUS W/TWL LRG LVL3 (GOWN DISPOSABLE) ×14
LIQUID BAND (GAUZE/BANDAGES/DRESSINGS) ×3 IMPLANT
NEEDLE INSUFFLATION 120MM (ENDOMECHANICALS) ×3 IMPLANT
NS IRRIG 1000ML POUR BTL (IV SOLUTION) IMPLANT
PACK LAVH (CUSTOM PROCEDURE TRAY) ×3 IMPLANT
PAD TRENDELENBURG OR TABLE (MISCELLANEOUS) ×3 IMPLANT
PROTECTOR NERVE ULNAR (MISCELLANEOUS) ×3 IMPLANT
SET CYSTO W/LG BORE CLAMP LF (SET/KITS/TRAYS/PACK) IMPLANT
SET IRRIG TUBING LAPAROSCOPIC (IRRIGATION / IRRIGATOR) IMPLANT
SHEARS HARMONIC ACE PLUS 36CM (ENDOMECHANICALS) ×3 IMPLANT
STRIP CLOSURE SKIN 1/4X3 (GAUZE/BANDAGES/DRESSINGS) IMPLANT
SUT VIC AB 1 CT1 18XBRD ANBCTR (SUTURE) ×2 IMPLANT
SUT VIC AB 1 CT1 8-18 (SUTURE) ×4
SUT VIC AB 2-0 CT1 (SUTURE) ×3 IMPLANT
SUT VIC AB 2-0 SH 27 (SUTURE) ×2
SUT VIC AB 2-0 SH 27XBRD (SUTURE) ×1 IMPLANT
SUT VICRYL 0 TIES 12 18 (SUTURE) ×3 IMPLANT
SUT VICRYL 0 UR6 27IN ABS (SUTURE) IMPLANT
SUT VICRYL 4-0 PS2 18IN ABS (SUTURE) ×3 IMPLANT
TOWEL OR 17X24 6PK STRL BLUE (TOWEL DISPOSABLE) ×6 IMPLANT
TRAY FOLEY CATH 14FR (SET/KITS/TRAYS/PACK) ×3 IMPLANT
TROCAR XCEL NON-BLD 5MMX100MML (ENDOMECHANICALS) ×9 IMPLANT
WARMER LAPAROSCOPE (MISCELLANEOUS) ×3 IMPLANT
WATER STERILE IRR 1000ML POUR (IV SOLUTION) ×3 IMPLANT

## 2014-11-15 NOTE — Progress Notes (Signed)
Post-op check Doing ok, pain ok with PCA, some nausea, wants foley out ASAP Afeb, VSS, adequate clear urine Abd- soft, incisions ok Continue routine care, order to remove foley when she can ambulate

## 2014-11-15 NOTE — Anesthesia Postprocedure Evaluation (Signed)
  Anesthesia Post-op Note  Patient: Beth Jimenez  Procedure(s) Performed: Procedure(s) with comments: LAPAROSCOPIC ASSISTED VAGINAL HYSTERECTOMY (N/A) - vagina,   Patient Location: PACU  Anesthesia Type:General  Level of Consciousness: awake, alert  and oriented  Airway and Oxygen Therapy: Patient Spontanous Breathing  Post-op Pain: none  Post-op Assessment: Post-op Vital signs reviewed, Patient's Cardiovascular Status Stable, Respiratory Function Stable, Patent Airway, No signs of Nausea or vomiting and Pain level controlled  Post-op Vital Signs: Reviewed and stable  Last Vitals:  Filed Vitals:   11/15/14 1200  BP: 115/61  Pulse: 88  Temp:   Resp: 15    Complications: No apparent anesthesia complications

## 2014-11-15 NOTE — Plan of Care (Signed)
Problem: Phase I Progression Outcomes Goal: I & O every 4 hrs or as ordered Outcome: Progressing

## 2014-11-15 NOTE — Plan of Care (Signed)
Problem: Phase II Progression Outcomes Goal: Pain controlled on oral analgesia Outcome: Progressing

## 2014-11-15 NOTE — Interval H&P Note (Signed)
History and Physical Interval Note:  11/15/2014 7:13 AM  Beth Jimenez  has presented today for surgery, with the diagnosis of Menorrhagia to anemia, Pelvic Pain, Fibroids,   The various methods of treatment have been discussed with the patient and family. After consideration of risks, benefits and other options for treatment, the patient has consented to  Procedure(s): LAPAROSCOPIC ASSISTED VAGINAL HYSTERECTOMY (N/A) as a surgical intervention .  The patient's history has been reviewed, patient examined, no change in status, stable for surgery.  I have reviewed the patient's chart and labs.  Questions were answered to the patient's satisfaction.    D/w pt chance of transfusion, also chance of conversion to TAH.     Bovard-Stuckert, Olla Delancey

## 2014-11-15 NOTE — Plan of Care (Signed)
Problem: Phase I Progression Outcomes Goal: VS, stable, temp < 100.4 degrees F Outcome: Progressing

## 2014-11-15 NOTE — Plan of Care (Signed)
Problem: Phase I Progression Outcomes Goal: Pain controlled with appropriate interventions Outcome: Progressing     

## 2014-11-15 NOTE — Transfer of Care (Signed)
Immediate Anesthesia Transfer of Care Note  Patient: Beth Jimenez  Procedure(s) Performed: Procedure(s) with comments: LAPAROSCOPIC ASSISTED VAGINAL HYSTERECTOMY (N/A) - vagina,   Patient Location: PACU  Anesthesia Type:General  Level of Consciousness: awake  Airway & Oxygen Therapy: Patient Spontanous Breathing  Post-op Assessment: Report given to PACU RN  Post vital signs: stable  Filed Vitals:   11/15/14 0623  BP: 127/77  Pulse: 89  Temp: 36.9 C  Resp: 18    Complications: No apparent anesthesia complications

## 2014-11-15 NOTE — Plan of Care (Signed)
Problem: Phase I Progression Outcomes Goal: Pain controlled with appropriate interventions Outcome: Completed/Met Date Met:  11/15/14 Goal: Dangle/OOB as tolerated per MD order Outcome: Completed/Met Date Met:  11/15/14 Goal: VS, stable, temp < 100.4 degrees F Outcome: Completed/Met Date Met:  11/15/14 Goal: I & O every 4 hrs or as ordered Outcome: Completed/Met Date Met:  11/15/14 Goal: IS, TCDB as ordered Outcome: Completed/Met Date Met:  11/15/14  Problem: Phase II Progression Outcomes Goal: Pain controlled on oral analgesia Outcome: Completed/Met Date Met:  11/15/14 Goal: Progress activity as tolerated unless otherwise ordered Outcome: Progressing Goal: Foley discontinued Outcome: Completed/Met Date Met:  11/15/14 Goal: Voiding trials/Bladder training within 48 hrs Outcome: Completed/Met Date Met:  11/15/14 Goal: Incision/dressings dry and intact Outcome: Completed/Met Date Met:  11/15/14 Goal: Remove staples if indicated/incision care Outcome: Not Applicable Date Met:  16/10/96

## 2014-11-15 NOTE — Anesthesia Preprocedure Evaluation (Addendum)
Anesthesia Evaluation  Patient identified by MRN, date of birth, ID band Patient awake    Reviewed: Allergy & Precautions, H&P , NPO status , Patient's Chart, lab work & pertinent test results, reviewed documented beta blocker date and time   Airway Mallampati: I  TM Distance: >3 FB Neck ROM: Full    Dental no notable dental hx. (+) Teeth Intact   Pulmonary neg pulmonary ROS,  breath sounds clear to auscultation  Pulmonary exam normal       Cardiovascular hypertension, Pt. on medications and Pt. on home beta blockers + Past MI Rhythm:Regular Rate:Normal     Neuro/Psych negative neurological ROS  negative psych ROS   GI/Hepatic negative GI ROS, Neg liver ROS,   Endo/Other  negative endocrine ROS  Renal/GU negative Renal ROS  negative genitourinary   Musculoskeletal negative musculoskeletal ROS (+)   Abdominal (+) - obese,   Peds  Hematology  (+) anemia , Beta Thalassemia   Anesthesia Other Findings   Reproductive/Obstetrics Menorrhagia Pelvic pain fibroids                            Anesthesia Physical Anesthesia Plan  ASA: II  Anesthesia Plan: General   Post-op Pain Management:    Induction: Intravenous  Airway Management Planned: Oral ETT  Additional Equipment:   Intra-op Plan:   Post-operative Plan: Extubation in OR  Informed Consent: I have reviewed the patients History and Physical, chart, labs and discussed the procedure including the risks, benefits and alternatives for the proposed anesthesia with the patient or authorized representative who has indicated his/her understanding and acceptance.   Dental advisory given  Plan Discussed with: CRNA, Anesthesiologist and Surgeon  Anesthesia Plan Comments:         Anesthesia Quick Evaluation

## 2014-11-15 NOTE — Plan of Care (Signed)
Problem: Phase I Progression Outcomes Goal: IS, TCDB as ordered Outcome: Progressing

## 2014-11-15 NOTE — Brief Op Note (Signed)
11/15/2014  10:47 AM  PATIENT:  Beth Jimenez  48 y.o. female  PRE-OPERATIVE DIAGNOSIS:  Menorrhagia to anemia, Pelvic Pain, Fibroids,   POST-OPERATIVE DIAGNOSIS:  Menorrhagia to anemia, Pelvic Pain, Fibroids,   PROCEDURE:  Procedure(s) with comments: LAPAROSCOPIC ASSISTED VAGINAL HYSTERECTOMY (N/A) - vagina,   FINDING: 12 wk sized, multiple fibroid uterus, nl ovaries and tubes B, uterine wt 453g  SURGEON:  Surgeon(s) and Role:    * Janyth Contes, MD - Primary    * Logan Bores, MD - Assisting  ANESTHESIA:   general  EBL:  Total I/O In: 2870 [I.V.:2200; Blood:670] Out: 116 [Urine:500; Blood:275]  BLOOD ADMINISTERED:670 CC PRBC  DRAINS: Urinary Catheter (Foley)   LOCAL MEDICATIONS USED:  Local - marcaine  SPECIMEN:  Source of Specimen:  uterus and cervix  DISPOSITION OF SPECIMEN:  PATHOLOGY  COUNTS:  YES  TOURNIQUET:  * No tourniquets in log *  DICTATION: .Other Dictation: Dictation Number [id number]  PLAN OF CARE: Admit for overnight observation  PATIENT DISPOSITION:  PACU - hemodynamically stable.   Delay start of Pharmacological VTE agent (>24hrs) due to surgical blood loss or risk of bleeding: not applicable

## 2014-11-15 NOTE — Plan of Care (Signed)
Problem: Phase I Progression Outcomes Goal: Dangle/OOB as tolerated per MD order Outcome: Progressing

## 2014-11-15 NOTE — Plan of Care (Signed)
Problem: Phase I Progression Outcomes Goal: Admission history reviewed Outcome: Completed/Met Date Met:  11/15/14

## 2014-11-16 ENCOUNTER — Encounter (HOSPITAL_COMMUNITY): Payer: Self-pay | Admitting: Obstetrics and Gynecology

## 2014-11-16 DIAGNOSIS — D259 Leiomyoma of uterus, unspecified: Secondary | ICD-10-CM | POA: Diagnosis not present

## 2014-11-16 LAB — CBC
HEMATOCRIT: 24.8 % — AB (ref 36.0–46.0)
Hemoglobin: 7.8 g/dL — ABNORMAL LOW (ref 12.0–15.0)
MCH: 21.1 pg — AB (ref 26.0–34.0)
MCHC: 31.5 g/dL (ref 30.0–36.0)
MCV: 67.2 fL — ABNORMAL LOW (ref 78.0–100.0)
Platelets: 404 10*3/uL — ABNORMAL HIGH (ref 150–400)
RBC: 3.69 MIL/uL — AB (ref 3.87–5.11)
RDW: 26 % — ABNORMAL HIGH (ref 11.5–15.5)
WBC: 12.6 10*3/uL — AB (ref 4.0–10.5)

## 2014-11-16 LAB — BASIC METABOLIC PANEL
Anion gap: 12 (ref 5–15)
BUN: 11 mg/dL (ref 6–23)
CO2: 24 meq/L (ref 19–32)
Calcium: 8.6 mg/dL (ref 8.4–10.5)
Chloride: 103 mEq/L (ref 96–112)
Creatinine, Ser: 0.98 mg/dL (ref 0.50–1.10)
GFR calc Af Amer: 77 mL/min — ABNORMAL LOW (ref >90)
GFR, EST NON AFRICAN AMERICAN: 67 mL/min — AB (ref >90)
Glucose, Bld: 109 mg/dL — ABNORMAL HIGH (ref 70–99)
POTASSIUM: 3.7 meq/L (ref 3.7–5.3)
SODIUM: 139 meq/L (ref 137–147)

## 2014-11-16 MED ORDER — IBUPROFEN 800 MG PO TABS: 800.0000 mg | ORAL_TABLET | Freq: Three times a day (TID) | ORAL | Status: DC | PRN

## 2014-11-16 MED ORDER — OXYCODONE-ACETAMINOPHEN 5-325 MG PO TABS: 1.0000 | ORAL_TABLET | Freq: Four times a day (QID) | ORAL | Status: DC | PRN

## 2014-11-16 NOTE — Discharge Summary (Signed)
Physician Discharge Summary  Patient ID: Beth Jimenez MRN: [id number] DOB/AGE: Jun 14, 1966 48 y.o.  Admit date: 11/15/2014 Discharge date: 11/16/2014  Admission Diagnoses: Pelvic pain, menorrhagia to anemia  Discharge Diagnoses:  Active Problems:   Pelvic pain in female   Leiomyoma of uterus   S/P laparoscopic assisted vaginal hysterectomy (LAVH)   Discharged Condition: stable  Hospital Course: admitted 12/3 for LAVH of fibroid uterus, given pain and menorrhagia.  2 units PRBCs in OR, given anemia - immediately pre-op = 6.2.  Hgb stable after surgery.  Tolerated procedure well.  Ambulating, voiding and toklerating po, d/c to home.    Consults: None  Significant Diagnostic Studies: labs: CBC  Treatments: analgesia: Morphine and po percocet/ibuprofen, surgery: LAVH and 2u pRBCs transfusion  Discharge Exam: Blood pressure 120/65, pulse 84, temperature 99.3 F (37.4 C), temperature source Oral, resp. rate 16, height 5\' 5"  (1.651 m), weight 74.844 kg (165 lb), SpO2 98 %. General appearance: alert, cooperative and no distress Resp: clear to auscultation bilaterally Cardio: regular rate and rhythm GI: normal findings: soft, non-tender Extremities: extremities normal, atraumatic, no cyanosis or edema Incision/Wound:C/D/I  Disposition:   Discharge Instructions    Call MD for:  persistant nausea and vomiting    Complete by:  As directed      Call MD for:  redness, tenderness, or signs of infection (pain, swelling, redness, odor or green/yellow discharge around incision site)    Complete by:  As directed      Call MD for:  severe uncontrolled pain    Complete by:  As directed      Diet - low sodium heart healthy    Complete by:  As directed      Discharge instructions    Complete by:  As directed   Call [phone] with questions or problems     Driving Restrictions    Complete by:  As directed   While taking strong pain medicine     Increase activity slowly    Complete by:  As  directed      Lifting restrictions    Complete by:  As directed   No greater than 10-20 lbs for 6 weeks     May shower / Bathe    Complete by:  As directed      May walk up steps    Complete by:  As directed      Sexual Activity Restrictions    Complete by:  As directed   Pelvic rest - no douching, tampons or sex for 6 weeks            Medication List    STOP taking these medications        predniSONE 20 MG tablet  Commonly known as:  DELTASONE      TAKE these medications        amLODipine 10 MG tablet  Commonly known as:  NORVASC  TAKE ONE TABLET BY MOUTH EVERY DAY     ferrous sulfate 325 (65 FE) MG tablet  Take 325 mg by mouth 2 (two) times daily with a meal.     folic acid 542 MCG tablet  Commonly known as:  FOLVITE  Take 400 mcg by mouth 2 (two) times daily.     ibuprofen 800 MG tablet  Commonly known as:  ADVIL,MOTRIN  Take 1 tablet (800 mg total) by mouth every 8 (eight) hours as needed (mild pain).     multivitamin tablet  Take 1 tablet by mouth daily.  OVER THE COUNTER MEDICATION  Take by mouth. BURN- weight loss pill. Takes 1 tablet at breakfast and 1 at lunch.     OVER THE COUNTER MEDICATION  Take by mouth. Takes a Purifier tablet in the eveing.     oxyCODONE-acetaminophen 5-325 MG per tablet  Commonly known as:  PERCOCET/ROXICET  Take 1-2 tablets by mouth every 6 (six) hours as needed for severe pain.           Follow-up Information    Follow up with Bovard-Stuckert, Clearnce Leja, MD. Schedule an appointment as soon as possible for a visit in 2 weeks.   Specialty:  Obstetrics and Gynecology   Why:  for incision check, 6 wks for full post-op check   Contact information:   [address] [place] [address] [phone]       Signed: Janyth Contes 11/16/2014, 8:40 AM

## 2014-11-16 NOTE — Op Note (Signed)
NAMETYTEANNA, OST                  ACCOUNT NO.:  [id number]  MEDICAL RECORD NO.:  [id number]  LOCATION:  5409                          FACILITY:  [facility]  PHYSICIAN:  Thornell Sartorius, MD        DATE OF BIRTH:  1966-08-26  DATE OF PROCEDURE:  11/15/2014 DATE OF DISCHARGE:                              OPERATIVE REPORT   PREOPERATIVE DIAGNOSES:  Menorrhagia to anemia, pelvic pain, fibroids.  POSTOPERATIVE DIAGNOSES:  Menorrhagia to anemia, pelvic pain, fibroids.  PROCEDURE:  Laparoscopic-assisted vaginal hysterectomy.  FINDINGS:  Twelve-week size multiple fibroid uterus.  Normal ovaries and tubes bilaterally.  Uterine weight was 453 at the end of the procedure.  SURGEON:  Thornell Sartorius, MD  ASSISTANT:  Paula Compton, M.D.  ANESTHESIA:  General.  IV FLUIDS:  2200 mL blood, 670 mL urine output, 500 mL clear urine at the end of the procedure.  EBL:  Approximately 275 mL.  COMPLICATIONS:  None.  Need to core out the uterus.  PATHOLOGY:  Uterus and cervix to Pathology.  DESCRIPTION OF PROCEDURE:  After informed consent was reviewed with the patient and her daughter, she was transported to the OR, placed on the table in supine position.  General anesthesia was induced and found to be adequate.  She was then placed in the Y[place]in stirrups, prepped and draped in a normal sterile fashion.  Foley catheter was sterilely placed.  Using an open-sided speculum and a single-tooth tenaculum, a Hulka manipulator was placed into her uterus.  Gloves and gown were changed.  Attention was turned to the abdominal portion of the case and approximately 20-mm horizontal infraumbilical incision was made using the Veress needle.  Pneumoperitoneum was obtained after careful checking with the hanging drop test.  Accessory ports were placed on the right and left Trendelenburg.  On visualization of the pelvis, a band of adhesion tissue from the uterus to the anterior abdominal wall from the fundus to the  level of the cervix was noted, this was incised with Harmonic scalpel.  Using laparoscopic tenaculum, the adnexal structures were identified and decision was made to proceed with the laparoscopic aspect of the surgery.  The ovary was separated from the uterus and in a stepwise fashion, the round ligament was also, this was using the Harmonic scalpel.  The mesosalpinx was taken down to the level of the bladder.  Bladder flap was created from both sides meeting in the middle.  Attention was turned to the vaginal portion of the case.  The cervix was transected with Bovie cautery.  Attempt was made to enter anteriorly, this was unsuccessful posteriorly.  The posterior cul-de-sac was entered with sharp dissection.  The uterosacral ligaments were ligated and held in a stepwise fashion.  Further bites were taken to amputate the cervix.  This was somewhat difficult given the bulk of the uterus.  After the uterine arteries had been ligated, decision was made to proceed with coring out the uterus, it was cored in several attempts, and then delivered successfully.  After the uterus was delivered, the cuff was made hemostatic.  The posterior portion of the cuff was ran. The uterosacral ligaments were plicated and doubly tied.  The cuff was closed with 2-0 Vicryl in a running locked fashion.  Gloves and gowns were changed.  Attention was turned to the abdominal portion of the case.  Clot was noted, but no active bleeding.  Pelvis was irrigated copiously.  The trocar sites were closed with 3-0 Vicryl as a deep stitch and dermoid on the skin.  The patient tolerated the procedure well.  Sponge, lap, and needle count were correct x2 at the end of the case per the operating room staff.     Thornell Sartorius, MD     JB/MEDQ  D:  11/15/2014  T:  11/15/2014  Job:  [id number]

## 2014-11-16 NOTE — Plan of Care (Signed)
Problem: Phase I Progression Outcomes Goal: Other Phase I Outcomes/Goals Outcome: Completed/Met Date Met:  11/16/14

## 2014-11-16 NOTE — Addendum Note (Signed)
Addendum  created 11/16/14 0944 by Garner Nash, CRNA   Modules edited: Notes Section   Notes Section:  File: [id number]

## 2014-11-16 NOTE — Progress Notes (Signed)
1 Day Post-Op Procedure(s) (LRB): LAPAROSCOPIC ASSISTED VAGINAL HYSTERECTOMY (N/A)  Subjective: Patient reports incisional pain and tolerating PO.  Doing well, ambulating.      Objective: I have reviewed patient's vital signs, intake and output and labs.  General: alert and no distress Resp: clear to auscultation bilaterally Cardio: regular rate and rhythm GI: soft, non-tender; bowel sounds normal; no masses,  no organomegaly and incision: clean, dry and intact Extremities: extremities normal, atraumatic, no cyanosis or edema Vaginal Bleeding: none  Assessment: s/p Procedure(s) with comments: LAPAROSCOPIC ASSISTED VAGINAL HYSTERECTOMY (N/A) - vagina, : stable, progressing well and tolerating diet  Plan: Advance diet Encourage ambulation Advance to PO medication Discontinue IV fluids Discharge home when meeting guidelines.  D/C with motrin and percocet.  F/u in office 2 weeks/6 weeks.    LOS: 1 day    Bovard-Stuckert, Rylan Kaufmann 11/16/2014, 8:20 AM

## 2014-11-16 NOTE — Plan of Care (Signed)
Problem: Phase II Progression Outcomes Goal: Progress activity as tolerated unless otherwise ordered Outcome: Completed/Met Date Met:  11/16/14 Goal: Afebrile, VS remain stable Outcome: Completed/Met Date Met:  11/16/14 Goal: Other Phase II Outcomes/Goals Outcome: Completed/Met Date Met:  11/16/14

## 2014-11-16 NOTE — Anesthesia Postprocedure Evaluation (Signed)
  Anesthesia Post-op Note  Patient: Beth Jimenez  Procedure(s) Performed: Procedure(s) with comments: LAPAROSCOPIC ASSISTED VAGINAL HYSTERECTOMY (N/A) - vagina,   Patient Location: [facility]  Anesthesia Type:General  Level of Consciousness: awake and alert   Airway and Oxygen Therapy: Patient Spontanous Breathing  Post-op Pain: mild  Post-op Assessment: Post-op Vital signs reviewed, Patient's Cardiovascular Status Stable, Respiratory Function Stable, No signs of Nausea or vomiting and Pain level controlled  Post-op Vital Signs: Reviewed  Last Vitals:  Filed Vitals:   11/16/14 0550  BP: 120/65  Pulse: 84  Temp: 37.4 C  Resp: 16    Complications: No apparent anesthesia complications

## 2014-11-16 NOTE — Progress Notes (Signed)
Pt verbalizes understanding of d/c instructions, medications, follow up appts, when to seek medical attention and belongings policy. IVs were d/c without complications. Pt has no questions. Pt has copy of instructions. Pt and husband escorted to main entrance, pt in wheelchair. Marry Guan

## 2014-11-17 LAB — TYPE AND SCREEN
ABO/RH(D): O POS
Antibody Screen: NEGATIVE
UNIT DIVISION: 0
UNIT DIVISION: 0
UNIT DIVISION: 0
Unit division: 0

## 2015-06-10 ENCOUNTER — Other Ambulatory Visit: Payer: Self-pay | Admitting: Family Medicine

## 2015-06-10 DIAGNOSIS — N644 Mastodynia: Secondary | ICD-10-CM

## 2015-06-10 DIAGNOSIS — N63 Unspecified lump in unspecified breast: Secondary | ICD-10-CM

## 2015-07-11 ENCOUNTER — Other Ambulatory Visit: Payer: Self-pay | Admitting: Obstetrics and Gynecology

## 2015-07-11 DIAGNOSIS — R928 Other abnormal and inconclusive findings on diagnostic imaging of breast: Secondary | ICD-10-CM

## 2015-07-16 ENCOUNTER — Ambulatory Visit
Admission: RE | Admit: 2015-07-16 | Discharge: 2015-07-16 | Disposition: A | Discharge status: Home / Self Care | Payer: Managed Care, Other (non HMO) | Source: Ambulatory Visit | Attending: Obstetrics and Gynecology | Admitting: Obstetrics and Gynecology

## 2015-07-16 DIAGNOSIS — R928 Other abnormal and inconclusive findings on diagnostic imaging of breast: Secondary | ICD-10-CM

## 2016-01-08 ENCOUNTER — Ambulatory Visit (INDEPENDENT_AMBULATORY_CARE_PROVIDER_SITE_OTHER): Payer: Managed Care, Other (non HMO)

## 2016-01-08 ENCOUNTER — Ambulatory Visit (INDEPENDENT_AMBULATORY_CARE_PROVIDER_SITE_OTHER): Payer: Managed Care, Other (non HMO) | Admitting: Physician Assistant

## 2016-01-08 VITALS — BP 182/100 | HR 97 | Temp 98.8°F | Resp 16 | Ht 65.5 in | Wt 179.0 lb

## 2016-01-08 DIAGNOSIS — M25521 Pain in right elbow: Secondary | ICD-10-CM | POA: Diagnosis not present

## 2016-01-08 DIAGNOSIS — M542 Cervicalgia: Secondary | ICD-10-CM

## 2016-01-08 DIAGNOSIS — M5410 Radiculopathy, site unspecified: Secondary | ICD-10-CM

## 2016-01-08 DIAGNOSIS — M25511 Pain in right shoulder: Secondary | ICD-10-CM

## 2016-01-08 DIAGNOSIS — M47812 Spondylosis without myelopathy or radiculopathy, cervical region: Secondary | ICD-10-CM | POA: Diagnosis not present

## 2016-01-08 MED ORDER — CYCLOBENZAPRINE HCL 10 MG PO TABS: 10.0000 mg | ORAL_TABLET | Freq: Three times a day (TID) | ORAL | Status: DC | PRN

## 2016-01-08 MED ORDER — MELOXICAM 7.5 MG PO TABS: 7.5000 mg | ORAL_TABLET | Freq: Every day | ORAL | Status: DC

## 2016-01-08 NOTE — Progress Notes (Signed)
[facility] [address] [place] [address] [phone]  Date:  01/08/2016   Name:  Beth Jimenez   DOB:  1966/01/16   MRN:  [id number]  PCP:  [name], MD   Chief Complaint  Patient presents with  . Arm Pain    right side, neck , shoulder, elbow, hand    History of Present Illness:  Beth Jimenez is a 50 y.o. female patient who presents to [facility]   Right sided shoulder pain that has her fingers tingling for 1.5 months.  She has been using blue.  She works out a lot.  She has changed bras.  Comes down at the right side of her neck, and comes down shoulder, to the lateral side of her elbow.  Aerobics.  Resistance bands.  Extension.  She feels as if her right outside of her elbow is swollen.  Hand feels tight at times, and had some weakness .  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   Her feet were throbbing last night which resolved.  Patient also states that 2 days ago, she went bowling and played very hard.     Patient Active Problem List   Diagnosis Date Noted  . S/P laparoscopic assisted vaginal hysterectomy (LAVH) 11/15/2014  . Pelvic pain in female 11/14/2014  . Leiomyoma of uterus 11/14/2014  . Microcytosis 08/03/2014  . 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 Menorrhagia 07/03/2014    Past Medical History  Diagnosis Date  . Blood transfusion without reported diagnosis   . Hypertension   . Fibroids   . Abnormal Pap smear of cervix   . Microcytosis 08/03/2014  . Anemia   . 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 Heart murmur     Past Surgical History  Procedure Laterality Date  . Myomectomy    . Laparoscopic assisted vaginal hysterectomy N/A 11/15/2014    Procedure: LAPAROSCOPIC ASSISTED VAGINAL HYSTERECTOMY;  Surgeon: Janyth Contes, MD;  Location: [facility];  Service: Gynecology;  Laterality: N/A;  vagina,   . Cesarean section    . Abdominal  hysterectomy      Social History  Substance Use Topics  . Smoking status: Never Smoker   . Smokeless tobacco: Never Used  . Alcohol Use: No    Family History  Problem Relation Age of Onset  . Cancer Mother     lung ca  . Cancer Maternal Grandmother     lung ca    Allergies  Allergen Reactions  . Cinnamon Swelling  . Peppermint Oil Swelling  . Feraheme [Ferumoxytol] Hives    Medication list has been reviewed and updated.  No current outpatient prescriptions on file prior to visit.   No current facility-administered medications on file prior to visit.    ROS   Physical Examination: BP 148/92 mmHg  Pulse 97  Temp(Src) 98.8 F (37.1 C) (Oral)  Resp 16  Ht 5' 5.5" (1.664 m)  Wt 179 lb (81.194 kg)  BMI 29.32 kg/m2  SpO2 99%  LMP 10/12/2014 Ideal Body Weight: Weight in (lb) to have BMI = 25: 152.2  Physical Exam  Constitutional: She is oriented to person, place, and time. She appears well-developed and well-nourished. No distress.  HENT:  Head: Normocephalic and atraumatic.  Right Ear: External ear normal.  Left Ear: External ear normal.  Eyes: Conjunctivae and EOM are normal. Pupils are equal, round, and reactive  to light.  Cardiovascular: Normal rate and regular rhythm.  Exam reveals no gallop and no friction rub.   No murmur heard. Pulses:      Carotid pulses are 2+ on the right side, and 2+ on the left side.      Radial pulses are 2+ on the right side, and 2+ on the left side.       Dorsalis pedis pulses are 2+ on the right side, and 2+ on the left side.  Pulmonary/Chest: Effort normal. No respiratory distress.  Musculoskeletal:  Cervical spinous tenderness.  Pain with extension of neck and right lateral deviation that sparks tingling and pain down the lateral side of arm.  Shoulder pain with external rotation.  Pain with hawkins test, Neers.  Normal strength throughout shoulder and elbows.   Negative Phalen.    Neurological: She is alert and oriented to  person, place, and time.  Skin: She is not diaphoretic.  Psychiatric: She has a normal mood and affect. Her behavior is normal.     Assessment and Plan: Beth Jimenez is a 50 y.o. female who is here today for neck, right shoulder, and right elbow pain. Likely the cause of her pain, is the spondylosis originating at the neck.  We will attempt the muscle relaxant at this time.   Also placed on anti-inlammatory low dose. I have advised neck, and shoulder stretching.  Advised ice 3 times per day for 15 minutes.  She will let me know if her neck and radiating pain do not improve.  Likely will send to ortho for further work up. Advised to restart her blood pressure medication.  Patient is in agreement.  She will check her bp.  If >1 time over 140/90 will restart amlodipine.    Neck pain - Plan: DG Cervical Spine Complete  Right shoulder pain - Plan: DG Shoulder Right  Elbow pain, right - Plan: DG Elbow Complete Right  Radiculopathy of arm - Plan: DG Cervical Spine Complete, DG Shoulder Right, DG Elbow Complete Right  Cervical spondylosis without myelopathy - Plan: meloxicam (MOBIC) 7.5 MG tablet, cyclobenzaprine (FLEXERIL) 10 MG tablet  Ivar Drape, PA-C [facility] 1/29/20178:10 PM

## 2016-01-08 NOTE — Patient Instructions (Addendum)
Please take medication as prescribed.  I would like you to do light stretches as listed below.  If you continue to have the symptoms in 10 days, please let me know.    Because you received an x-ray today, you will receive an invoice from [facility]. Please contact [facility] at [phone] with questions or concerns regarding your invoice. Our billing staff will not be able to assist you with those questions. Cervical Radiculopathy Cervical radiculopathy means that a nerve in the neck is pinched or bruised. This can cause pain or loss of feeling (numbness) that runs from your neck to your arm and fingers. HOME CARE Managing Pain  Take over-the-counter and prescription medicines only as told by your doctor.  If directed, put ice on the injured or painful area.  Put ice in a plastic bag.  Place a towel between your skin and the bag.  Leave the ice on for 20 minutes, 2-3 times per day.  If ice does not help, you can try using heat. Take a warm shower or warm bath, or use a heat pack as told by your doctor.  You may try a gentle neck and shoulder massage. Activity  Rest as needed. Follow instructions from your doctor about any activities to avoid.  Do exercises as told by your doctor or physical therapist. General Instructions   If you were given a soft collar, wear it as told by your doctor.  Use a flat pillow when you sleep.  Keep all follow-up visits as told by your doctor. This is important. GET HELP IF:  Your condition does not improve with treatment. GET HELP RIGHT AWAY IF:   Your pain gets worse and is not controlled with medicine.  You lose feeling or feel weak in your hand, arm, face, or leg.  You have a fever.  You have a stiff neck.  You cannot control when you poop or pee (have incontinence).  You have trouble with walking, balance, or talking.   This information is not intended to replace advice given to you by your health care  provider. Make sure you discuss any questions you have with your health care provider.   Document Released: 11/19/2011 Document Revised: 08/21/2015 Document Reviewed: 01/24/2015 Elsevier Interactive Patient Education 2016 [employer] Cervical Strain and Sprain With Rehab Cervical strain and sprain are injuries that commonly occur with "whiplash" injuries. Whiplash occurs when the neck is forcefully whipped backward or forward, such as during a motor vehicle accident or during contact sports. The muscles, ligaments, tendons, discs, and nerves of the neck are susceptible to injury when this occurs. RISK FACTORS Risk of having a whiplash injury increases if:  Osteoarthritis of the spine.  Situations that make head or neck accidents or trauma more likely.  High-risk sports (football, rugby, wrestling, hockey, auto racing, gymnastics, diving, contact karate, or boxing).  Poor strength and flexibility of the neck.  Previous neck injury.  Poor tackling technique.  Improperly fitted or padded equipment. SYMPTOMS   Pain or stiffness in the front or back of neck or both.  Symptoms may present immediately or up to 24 hours after injury.  Dizziness, headache, nausea, and vomiting.  Muscle spasm with soreness and stiffness in the neck.  Tenderness and swelling at the injury site. PREVENTION  Learn and use proper technique (avoid tackling with the head, spearing, and head-butting; use proper falling techniques to avoid landing on the head).  Warm up and stretch properly before activity.  Maintain physical  fitness:  Strength, flexibility, and endurance.  Cardiovascular fitness.  Wear properly fitted and padded protective equipment, such as padded soft collars, for participation in contact sports. PROGNOSIS  Recovery from cervical strain and sprain injuries is dependent on the extent of the injury. These injuries are usually curable in 1 week to 3 months with appropriate treatment.   RELATED COMPLICATIONS   Temporary numbness and weakness may occur if the nerve roots are damaged, and this may persist until the nerve has completely healed.  Chronic pain due to frequent recurrence of symptoms.  Prolonged healing, especially if activity is resumed too soon (before complete recovery). TREATMENT  Treatment initially involves the use of ice and medication to help reduce pain and inflammation. It is also important to perform strengthening and stretching exercises and modify activities that worsen symptoms so the injury does not get worse. These exercises may be performed at home or with a therapist. For patients who experience severe symptoms, a soft, padded collar may be recommended to be worn around the neck.  Improving your posture may help reduce symptoms. Posture improvement includes pulling your chin and abdomen in while sitting or standing. If you are sitting, sit in a firm chair with your buttocks against the back of the chair. While sleeping, try replacing your pillow with a small towel rolled to 2 inches in diameter, or use a cervical pillow or soft cervical collar. Poor sleeping positions delay healing.  For patients with nerve root damage, which causes numbness or weakness, the use of a cervical traction apparatus may be recommended. Surgery is rarely necessary for these injuries. However, cervical strain and sprains that are present at birth (congenital) may require surgery. MEDICATION   If pain medication is necessary, nonsteroidal anti-inflammatory medications, such as aspirin and ibuprofen, or other minor pain relievers, such as acetaminophen, are often recommended.  Do not take pain medication for 7 days before surgery.  Prescription pain relievers may be given if deemed necessary by your caregiver. Use only as directed and only as much as you need. HEAT AND COLD:   Cold treatment (icing) relieves pain and reduces inflammation. Cold treatment should be applied for  10 to 15 minutes every 2 to 3 hours for inflammation and pain and immediately after any activity that aggravates your symptoms. Use ice packs or an ice massage.  Heat treatment may be used prior to performing the stretching and strengthening activities prescribed by your caregiver, physical therapist, or athletic trainer. Use a heat pack or a warm soak. SEEK MEDICAL CARE IF:   Symptoms get worse or do not improve in 2 weeks despite treatment.  New, unexplained symptoms develop (drugs used in treatment may produce side effects). EXERCISES RANGE OF MOTION (ROM) AND STRETCHING EXERCISES - Cervical Strain and Sprain These exercises may help you when beginning to rehabilitate your injury. In order to successfully resolve your symptoms, you must improve your posture. These exercises are designed to help reduce the forward-head and rounded-shoulder posture which contributes to this condition. Your symptoms may resolve with or without further involvement from your physician, physical therapist or athletic trainer. While completing these exercises, remember:   Restoring tissue flexibility helps normal motion to return to the joints. This allows healthier, less painful movement and activity.  An effective stretch should be held for at least 20 seconds, although you may need to begin with shorter hold times for comfort.  A stretch should never be painful. You should only feel a gentle lengthening or release in  the stretched tissue. STRETCH- Axial Extensors  Lie on your back on the floor. You may bend your knees for comfort. Place a rolled-up hand towel or dish towel, about 2 inches in diameter, under the part of your head that makes contact with the floor.  Gently tuck your chin, as if trying to make a "double chin," until you feel a gentle stretch at the base of your head.  Hold __________ seconds. Repeat __________ times. Complete this exercise __________ times per day.  STRETCH - Axial Extension    Stand or sit on a firm surface. Assume a good posture: chest up, shoulders drawn back, abdominal muscles slightly tense, knees unlocked (if standing) and feet hip width apart.  Slowly retract your chin so your head slides back and your chin slightly lowers. Continue to look straight ahead.  You should feel a gentle stretch in the back of your head. Be certain not to feel an aggressive stretch since this can cause headaches later.  Hold for __________ seconds. Repeat __________ times. Complete this exercise __________ times per day. STRETCH - Cervical Side Bend   Stand or sit on a firm surface. Assume a good posture: chest up, shoulders drawn back, abdominal muscles slightly tense, knees unlocked (if standing) and feet hip width apart.  Without letting your nose or shoulders move, slowly tip your right / left ear to your shoulder until your feel a gentle stretch in the muscles on the opposite side of your neck.  Hold __________ seconds. Repeat __________ times. Complete this exercise __________ times per day. STRETCH - Cervical Rotators   Stand or sit on a firm surface. Assume a good posture: chest up, shoulders drawn back, abdominal muscles slightly tense, knees unlocked (if standing) and feet hip width apart.  Keeping your eyes level with the ground, slowly turn your head until you feel a gentle stretch along the back and opposite side of your neck.  Hold __________ seconds. Repeat __________ times. Complete this exercise __________ times per day. RANGE OF MOTION - Neck Circles   Stand or sit on a firm surface. Assume a good posture: chest up, shoulders drawn back, abdominal muscles slightly tense, knees unlocked (if standing) and feet hip width apart.  Gently roll your head down and around from the back of one shoulder to the back of the other. The motion should never be forced or painful.  Repeat the motion 10-20 times, or until you feel the neck muscles relax and loosen. Repeat  __________ times. Complete the exercise __________ times per day. STRENGTHENING EXERCISES - Cervical Strain and Sprain These exercises may help you when beginning to rehabilitate your injury. They may resolve your symptoms with or without further involvement from your physician, physical therapist, or athletic trainer. While completing these exercises, remember:   Muscles can gain both the endurance and the strength needed for everyday activities through controlled exercises.  Complete these exercises as instructed by your physician, physical therapist, or athletic trainer. Progress the resistance and repetitions only as guided.  You may experience muscle soreness or fatigue, but the pain or discomfort you are trying to eliminate should never worsen during these exercises. If this pain does worsen, stop and make certain you are following the directions exactly. If the pain is still present after adjustments, discontinue the exercise until you can discuss the trouble with your clinician. STRENGTH - Cervical Flexors, Isometric  Face a wall, standing about 6 inches away. Place a small pillow, a ball about 6-8 inches in  diameter, or a folded towel between your forehead and the wall.  Slightly tuck your chin and gently push your forehead into the soft object. Push only with mild to moderate intensity, building up tension gradually. Keep your jaw and forehead relaxed.  Hold 10 to 20 seconds. Keep your breathing relaxed.  Release the tension slowly. Relax your neck muscles completely before you start the next repetition. Repeat __________ times. Complete this exercise __________ times per day. STRENGTH- Cervical Lateral Flexors, Isometric   Stand about 6 inches away from a wall. Place a small pillow, a ball about 6-8 inches in diameter, or a folded towel between the side of your head and the wall.  Slightly tuck your chin and gently tilt your head into the soft object. Push only with mild to moderate  intensity, building up tension gradually. Keep your jaw and forehead relaxed.  Hold 10 to 20 seconds. Keep your breathing relaxed.  Release the tension slowly. Relax your neck muscles completely before you start the next repetition. Repeat __________ times. Complete this exercise __________ times per day. STRENGTH - Cervical Extensors, Isometric   Stand about 6 inches away from a wall. Place a small pillow, a ball about 6-8 inches in diameter, or a folded towel between the back of your head and the wall.  Slightly tuck your chin and gently tilt your head back into the soft object. Push only with mild to moderate intensity, building up tension gradually. Keep your jaw and forehead relaxed.  Hold 10 to 20 seconds. Keep your breathing relaxed.  Release the tension slowly. Relax your neck muscles completely before you start the next repetition. Repeat __________ times. Complete this exercise __________ times per day. POSTURE AND BODY MECHANICS CONSIDERATIONS - Cervical Strain and Sprain Keeping correct posture when sitting, standing or completing your activities will reduce the stress put on different body tissues, allowing injured tissues a chance to heal and limiting painful experiences. The following are general guidelines for improved posture. Your physician or physical therapist will provide you with any instructions specific to your needs. While reading these guidelines, remember:  The exercises prescribed by your provider will help you have the flexibility and strength to maintain correct postures.  The correct posture provides the optimal environment for your joints to work. All of your joints have less wear and tear when properly supported by a spine with good posture. This means you will experience a healthier, less painful body.  Correct posture must be practiced with all of your activities, especially prolonged sitting and standing. Correct posture is as important when doing repetitive  low-stress activities (typing) as it is when doing a single heavy-load activity (lifting). PROLONGED STANDING WHILE SLIGHTLY LEANING FORWARD When completing a task that requires you to lean forward while standing in one place for a long time, place either foot up on a stationary 2- to 4-inch high object to help maintain the best posture. When both feet are on the ground, the low back tends to lose its slight inward curve. If this curve flattens (or becomes too large), then the back and your other joints will experience too much stress, fatigue more quickly, and can cause pain.  RESTING POSITIONS Consider which positions are most painful for you when choosing a resting position. If you have pain with flexion-based activities (sitting, bending, stooping, squatting), choose a position that allows you to rest in a less flexed posture. You would want to avoid curling into a fetal position on your side. If your  pain worsens with extension-based activities (prolonged standing, working overhead), avoid resting in an extended position such as sleeping on your stomach. Most people will find more comfort when they rest with their spine in a more neutral position, neither too rounded nor too arched. Lying on a non-sagging bed on your side with a pillow between your knees, or on your back with a pillow under your knees will often provide some relief. Keep in mind, being in any one position for a prolonged period of time, no matter how correct your posture, can still lead to stiffness. WALKING Walk with an upright posture. Your ears, shoulders, and hips should all line up. OFFICE WORK When working at a desk, create an environment that supports good, upright posture. Without extra support, muscles fatigue and lead to excessive strain on joints and other tissues. CHAIR:  A chair should be able to slide under your desk when your back makes contact with the back of the chair. This allows you to work closely.  The chair's  height should allow your eyes to be level with the upper part of your monitor and your hands to be slightly lower than your elbows.  Body position:  Your feet should make contact with the floor. If this is not possible, use a foot rest.  Keep your ears over your shoulders. This will reduce stress on your neck and low back.   This information is not intended to replace advice given to you by your health care provider. Make sure you discuss any questions you have with your health care provider.   Document Released: 11/30/2005 Document Revised: 12/21/2014 Document Reviewed: 03/14/2009 Elsevier Interactive Patient Education [employer].

## 2016-01-10 ENCOUNTER — Telehealth: Payer: Self-pay

## 2016-01-10 DIAGNOSIS — I10 Essential (primary) hypertension: Secondary | ICD-10-CM

## 2016-01-10 MED ORDER — AMLODIPINE BESYLATE 10 MG PO TABS: 10.0000 mg | ORAL_TABLET | Freq: Every day | ORAL | Status: DC

## 2016-01-10 NOTE — Telephone Encounter (Signed)
Called pt and advised message from provider on their voicemail.  

## 2016-01-10 NOTE — Telephone Encounter (Signed)
[facility] [address],   Patient is asking you to call in her Blood pressure medication [facility]lodipine - [facility] [address] .  Discussed during OV.   [phone]

## 2016-01-10 NOTE — Telephone Encounter (Signed)
I can definitely refill this.  We did speak about this.  I am going to give you the one the amlodipine 10mg  that was prescribed.  I would like you to follow up with your pcp, or come back here for this blood pressure followup in 1 month.  Please ask her if she had any side effects to taking the amlodipine 10mg .

## 2016-01-10 NOTE — Telephone Encounter (Signed)
I do not see a discussion in the notes.

## 2016-01-12 ENCOUNTER — Encounter: Payer: Self-pay | Admitting: Physician Assistant

## 2016-02-24 ENCOUNTER — Ambulatory Visit (INDEPENDENT_AMBULATORY_CARE_PROVIDER_SITE_OTHER): Payer: Managed Care, Other (non HMO) | Admitting: Student

## 2016-02-24 VITALS — BP 130/76 | HR 94 | Ht 66.0 in | Wt 180.0 lb

## 2016-02-24 DIAGNOSIS — L905 Scar conditions and fibrosis of skin: Secondary | ICD-10-CM | POA: Diagnosis not present

## 2016-02-24 DIAGNOSIS — I10 Essential (primary) hypertension: Secondary | ICD-10-CM | POA: Diagnosis not present

## 2016-02-24 LAB — BASIC METABOLIC PANEL WITH GFR
BUN: 12 mg/dL (ref 7–25)
CHLORIDE: 103 mmol/L (ref 98–110)
CO2: 27 mmol/L (ref 20–31)
Calcium: 9.3 mg/dL (ref 8.6–10.4)
Creat: 0.91 mg/dL (ref 0.50–1.05)
GFR, Est African American: 85 mL/min (ref >=60)
GFR, Est Non African American: 74 mL/min (ref >=60)
Glucose, Bld: 94 mg/dL (ref 65–99)
POTASSIUM: 4 mmol/L (ref 3.5–5.3)
SODIUM: 139 mmol/L (ref 135–146)

## 2016-02-24 LAB — LIPID PANEL
CHOL/HDL RATIO: 3.3 ratio (ref <=5.0)
CHOLESTEROL: 164 mg/dL (ref 125–200)
HDL: 50 mg/dL (ref >=46)
LDL Cholesterol: 104 mg/dL (ref <130)
TRIGLYCERIDES: 51 mg/dL (ref <150)
VLDL: 10 mg/dL (ref <30)

## 2016-02-24 NOTE — Patient Instructions (Signed)
Follow-up as needed with PCP If you have questions or concerns please call the office at [phone]

## 2016-02-24 NOTE — Progress Notes (Signed)
   Subjective:    Patient ID: Beth Jimenez, female    DOB: 03/06/1966, 50 y.o.   MRN: [id number]   CC: New pateint  HPI: 50 y/o F presenting to establish care and concern for abscess on her back  Abscess - first noted 7 years ago and was "popped" by the patient and her daughter - approx 8 months ago she noted a hard area in the same place and has been concerned that the abscess has returned - she has no sought care for this - she denies fever, N/V/D  Review of Systems ROS  Per the HPI else she denies chest pain, SOB, abdominal pain, headache, changes in vision, weakness   Past Medical History  Diagnosis Date  . Blood transfusion without reported diagnosis   . Hypertension   . Fibroids   . Abnormal Pap smear of cervix   . Microcytosis 08/03/2014  . Anemia   . 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 Heart murmur     Past Surgical History  Procedure Laterality Date  . Myomectomy    . Laparoscopic assisted vaginal hysterectomy N/A 11/15/2014    Procedure: LAPAROSCOPIC ASSISTED VAGINAL HYSTERECTOMY;  Surgeon: Janyth Contes, MD;  Location: [facility];  Service: Gynecology;  Laterality: N/A;  vagina,   . Cesarean section    . Abdominal hysterectomy     Social History   Social History  . Marital Status: Married    Spouse Name: N/A  . Number of Children: N/A  . Years of Education: N/A   Social History Main Topics  . Smoking status: Never Smoker   . Smokeless tobacco: Never Used  . Alcohol Use: No  . Drug Use: No  . Sexual Activity: Not on file   Other Topics Concern  . Not on file   Social History Narrative     Past Medical, Surgical, Social, and Family History Reviewed & Updated per EMR.   Objective:  BP 130/76 mmHg  Pulse 94  Ht 5\' 6"  (1.676 m)  Wt 180 lb (81.647 kg)  BMI 29.07 kg/m2  SpO2 99%  LMP 10/12/2014 Vitals and nursing note reviewed  General: NAD Cardiac: RRR Respiratory: CTAB, normal effort Abdomen: soft,  nontender, nondistended,  Bowel sounds present Extremities: no edema or cyanosis. WWP. MSK: small, irregular, approximately .5 cm in greatest length area of firm tissue, consistent with scarring at the superior portion of the gluteal cleft  Skin: warm and dry, no rashes noted Neuro: alert and oriented, no focal deficits   Assessment & Plan:    Essential hypertension Well controlled with Norvasc. 10 year asvcd risk score 3.5% - will continue to follow - given ascvd risk score will hold on starting aspirin for primary CVD prevention   Scar Small area adjacent to gluteal cleft consistent with likely scarring after draining pervious abscess by patient - will follow if it becomes bothersome     Domingos Riggi A. Lincoln Brigham MD, [facility] Resident PGY-2 Pager [phone]

## 2016-02-25 DIAGNOSIS — I10 Essential (primary) hypertension: Secondary | ICD-10-CM | POA: Insufficient documentation

## 2016-02-25 DIAGNOSIS — L905 Scar conditions and fibrosis of skin: Secondary | ICD-10-CM | POA: Insufficient documentation

## 2016-02-25 NOTE — Assessment & Plan Note (Signed)
Small area adjacent to gluteal cleft consistent with likely scarring after draining pervious abscess by patient - will follow if it becomes bothersome

## 2016-02-25 NOTE — Assessment & Plan Note (Signed)
Well controlled with Norvasc. 10 year asvcd risk score 3.5% - will continue to follow - given ascvd risk score will hold on starting aspirin for primary CVD prevention

## 2016-02-26 ENCOUNTER — Telehealth: Payer: Self-pay | Admitting: Family Medicine

## 2016-02-26 NOTE — Telephone Encounter (Signed)
Would results from [facility] blood work

## 2016-02-27 ENCOUNTER — Telehealth: Payer: Self-pay | Admitting: Student

## 2016-02-27 ENCOUNTER — Encounter: Payer: Self-pay | Admitting: Student

## 2016-02-27 NOTE — Telephone Encounter (Signed)
Pt called to inform her that her lab results were normal. She did not answer so message was left asking her to call the office with questions or concerns. A letter will be sent informing her of her lab results

## 2016-04-24 ENCOUNTER — Ambulatory Visit: Payer: Managed Care, Other (non HMO) | Admitting: Family Medicine

## 2016-04-28 ENCOUNTER — Ambulatory Visit: Payer: Managed Care, Other (non HMO) | Admitting: Family Medicine

## 2016-12-17 IMAGING — CR DG SHOULDER 2+V*R*
3 series · 3 of 3 positions shown · non-contrast
Comparison: None.

CLINICAL DATA: Patient has 1.5 months of neck pain that radiates
down to the shoulder and to the outer part of her elbow (lateral
elbow). Prominent with forward flexion, or right lateral deviation,
as well as external rotation.

EXAM:
RIGHT SHOULDER - 2+ VIEW

[AP]
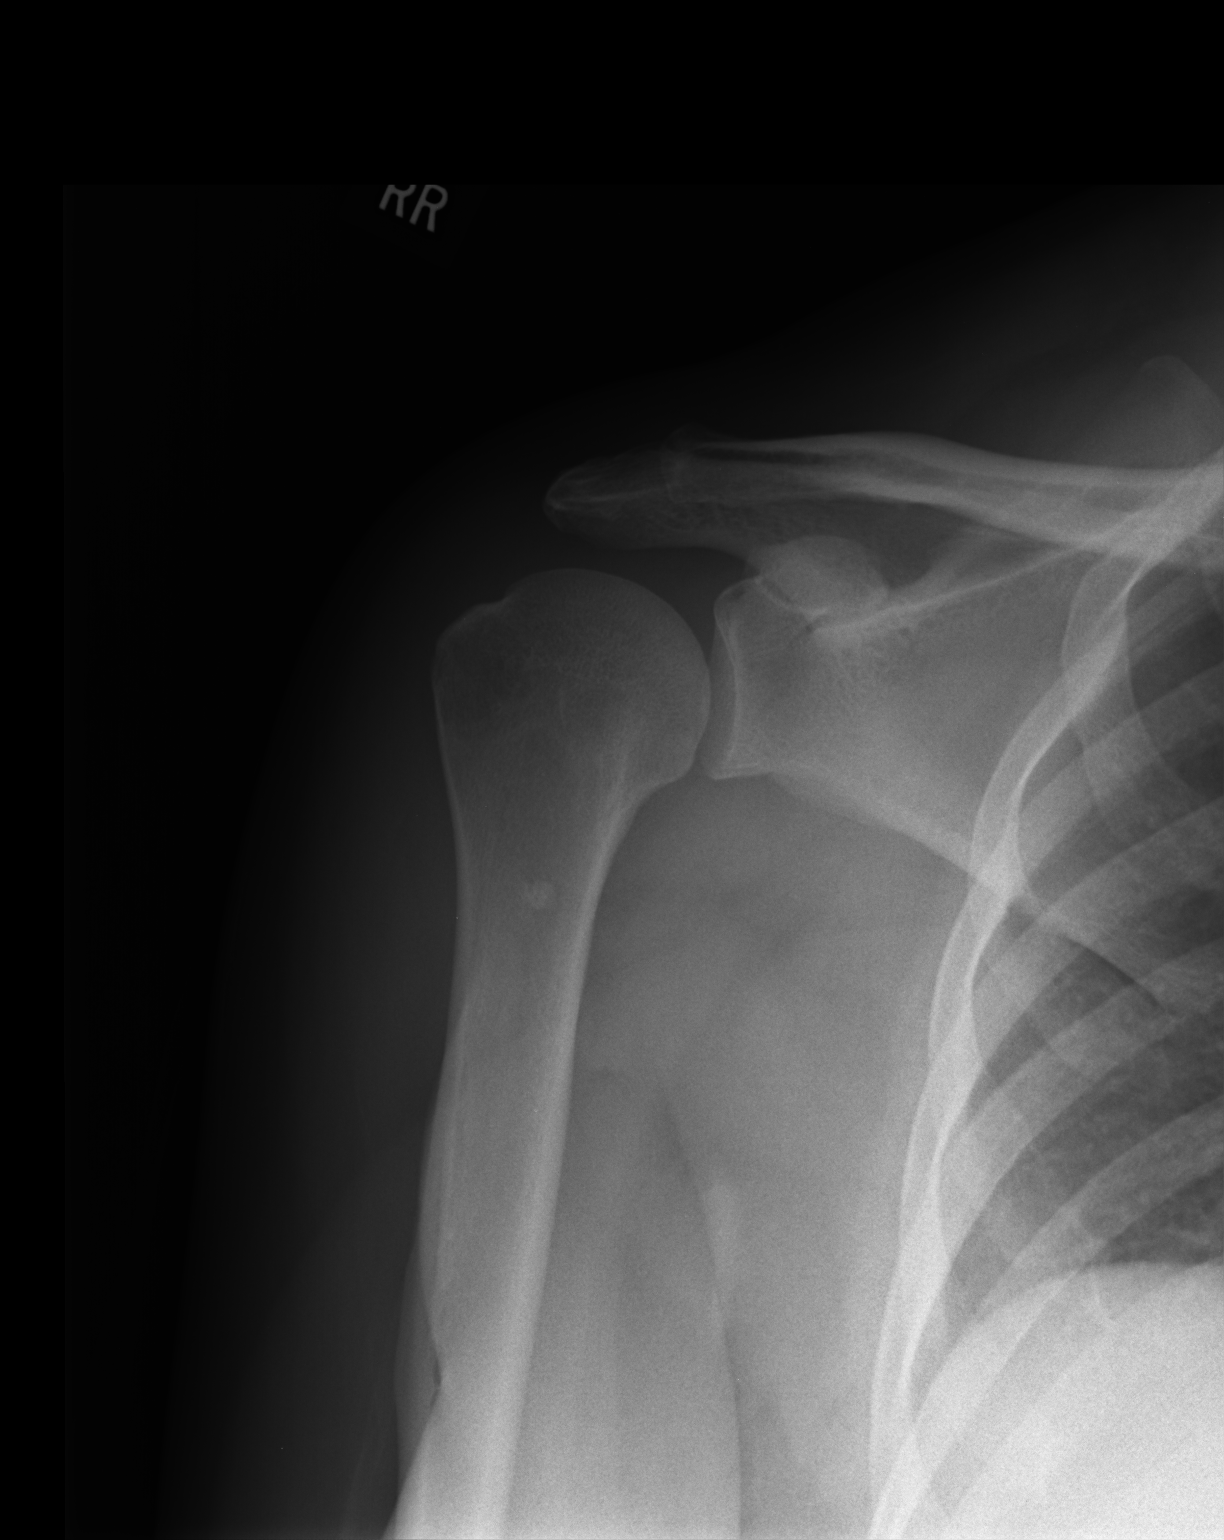

[pa y view]
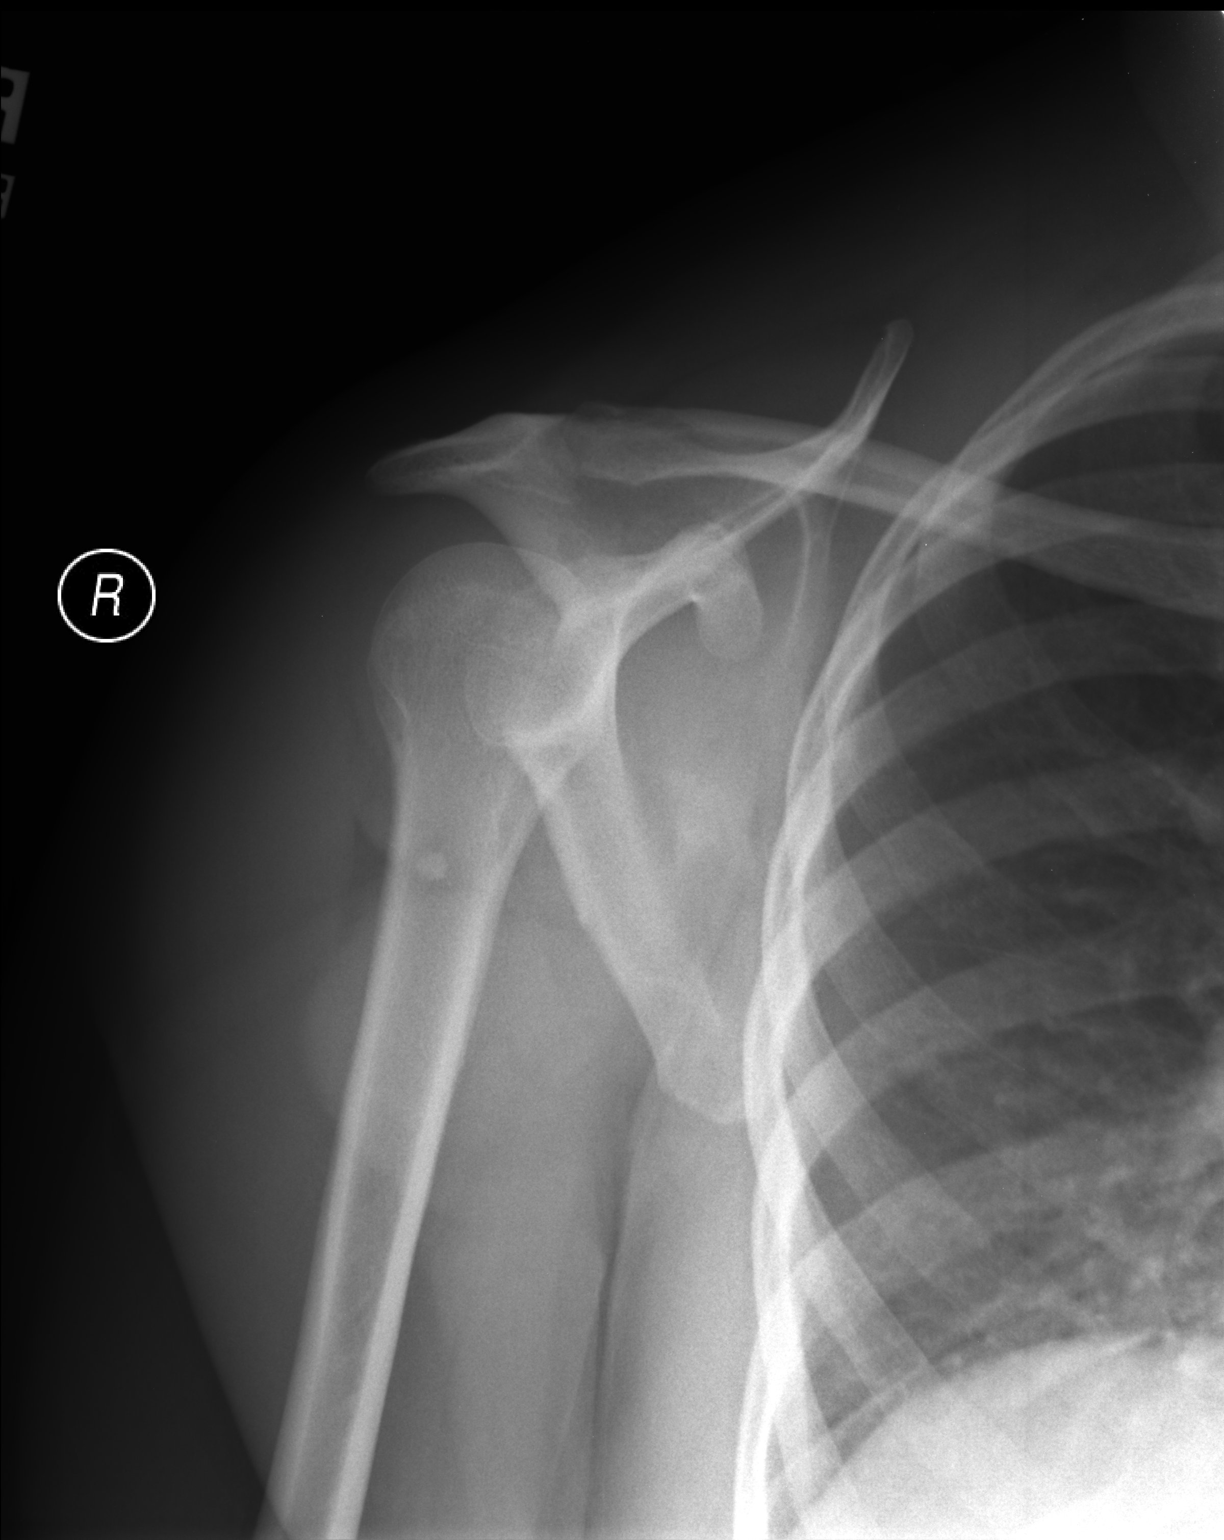

[axial]
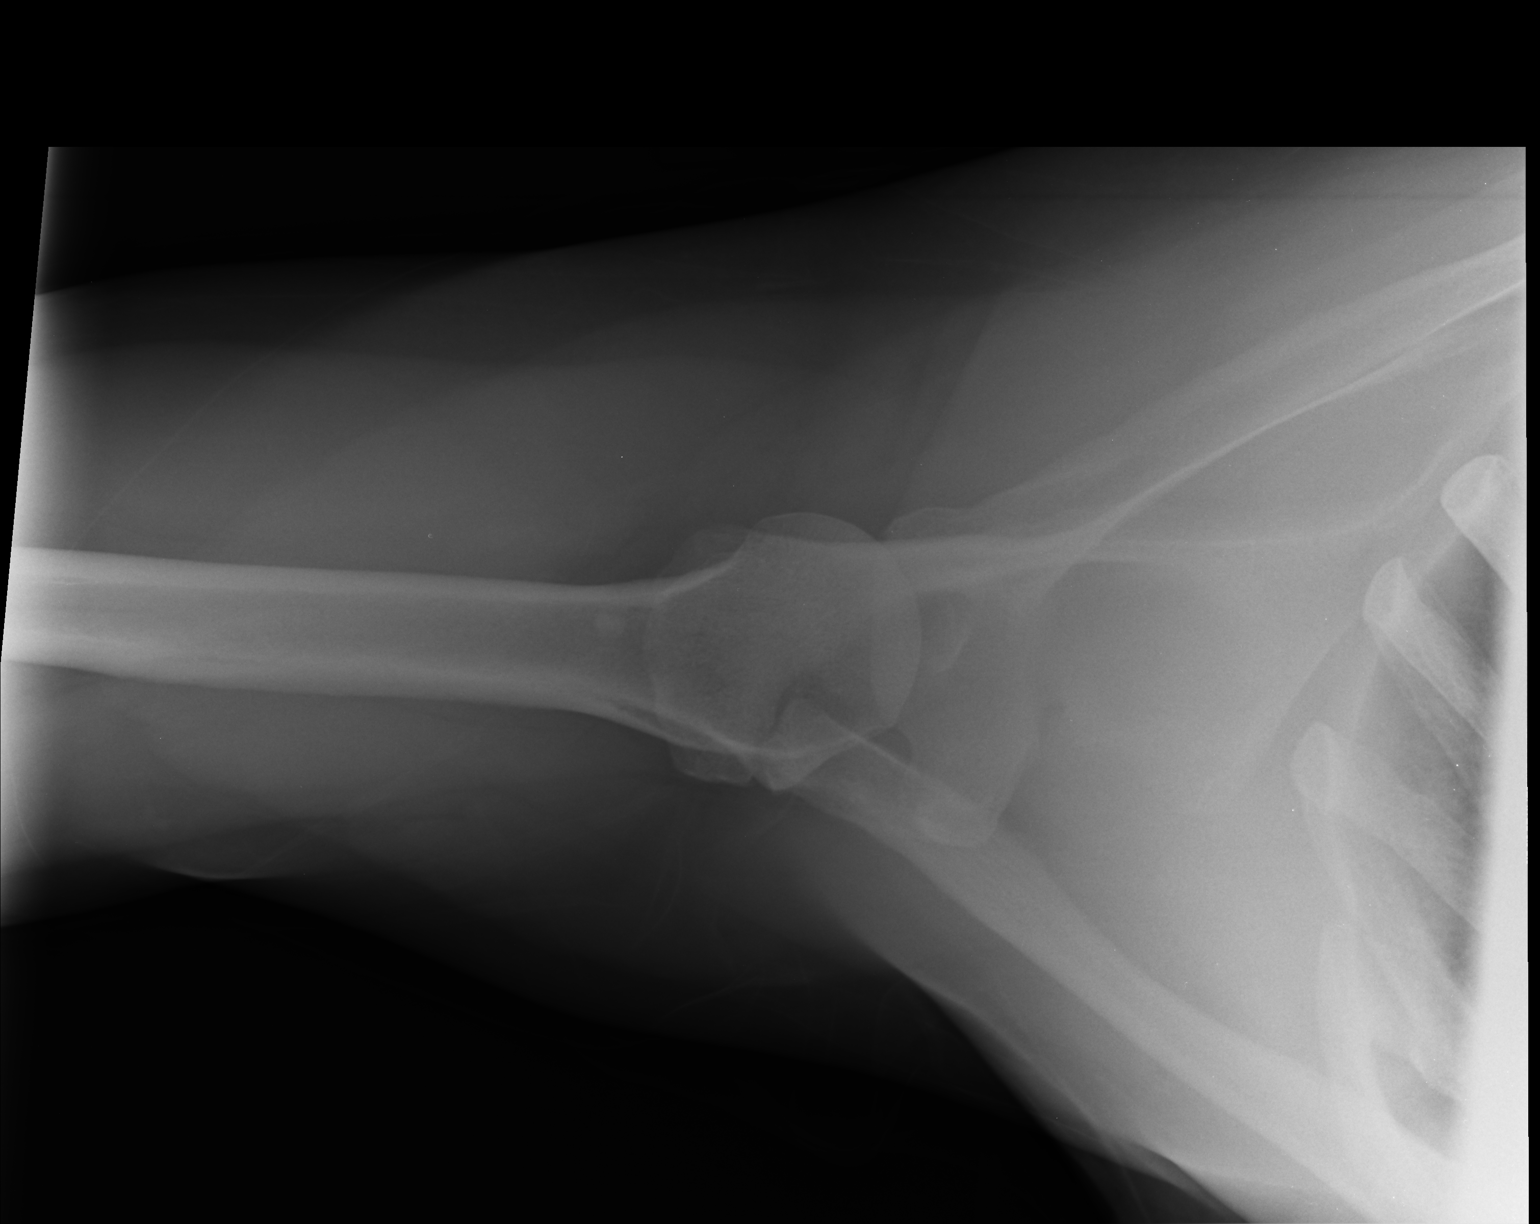

[3 of 3 positions shown; findings below may reference images not displayed]

FINDINGS: Visualized portion of the right hemithorax is normal. No acute
fracture or dislocation. Joint spaces maintained. Sclerotic lesion
in the proximal humerus measures 6 mm and is likely a bone island.
IMPRESSION: No acute osseous abnormality.

## 2016-12-17 IMAGING — CR DG CERVICAL SPINE COMPLETE 4+V
5 series · 5 of 5 positions shown · non-contrast
Comparison: None.

CLINICAL DATA: Neck pain and arm radiculopathy for 6 weeks. No
known injury.

EXAM:
CERVICAL SPINE - COMPLETE 4+ VIEW

[lpo]
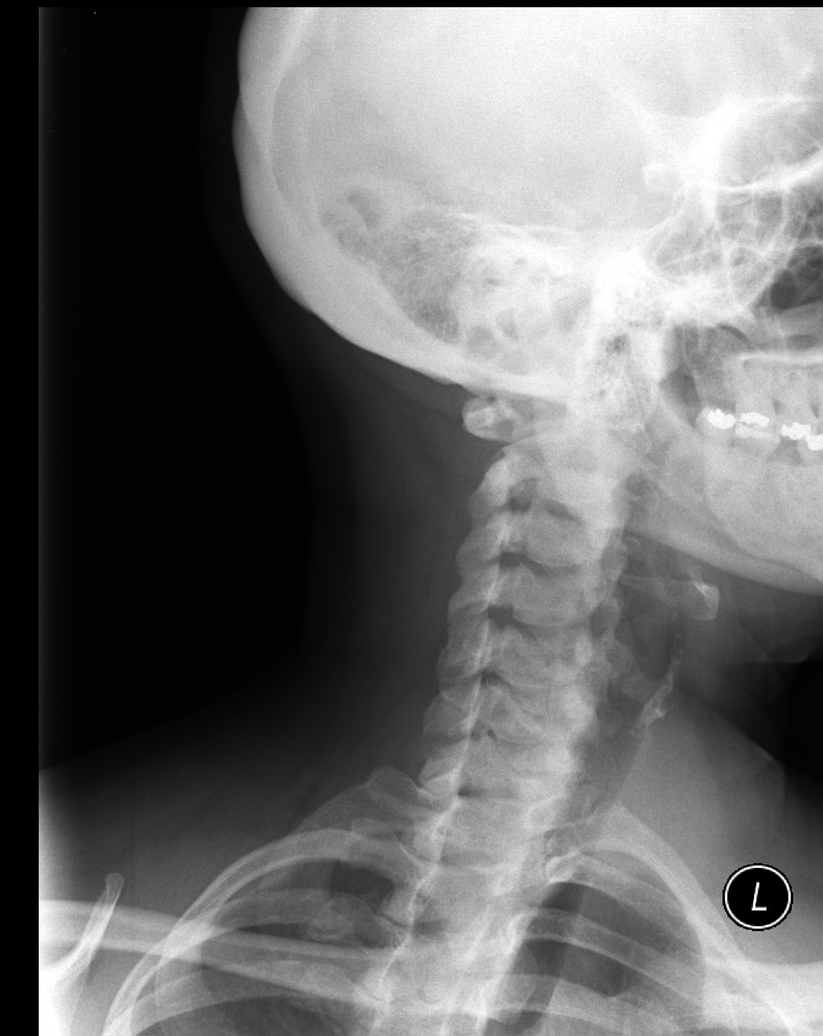

[lateral]
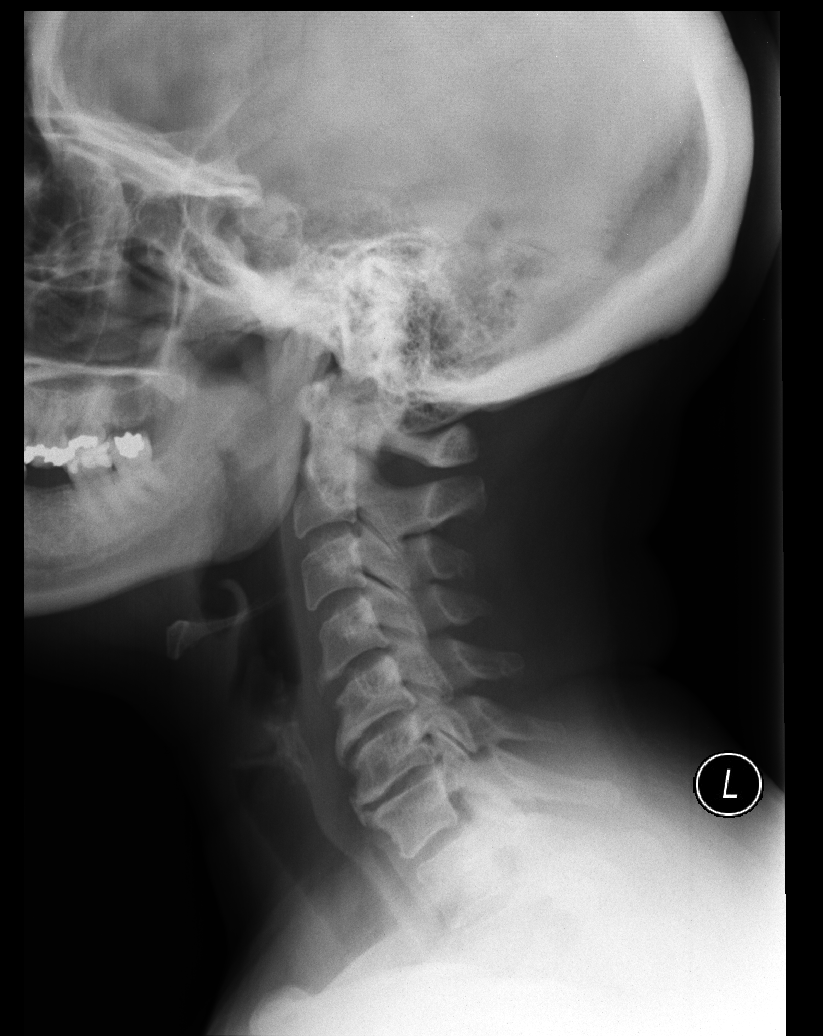

[rpo]
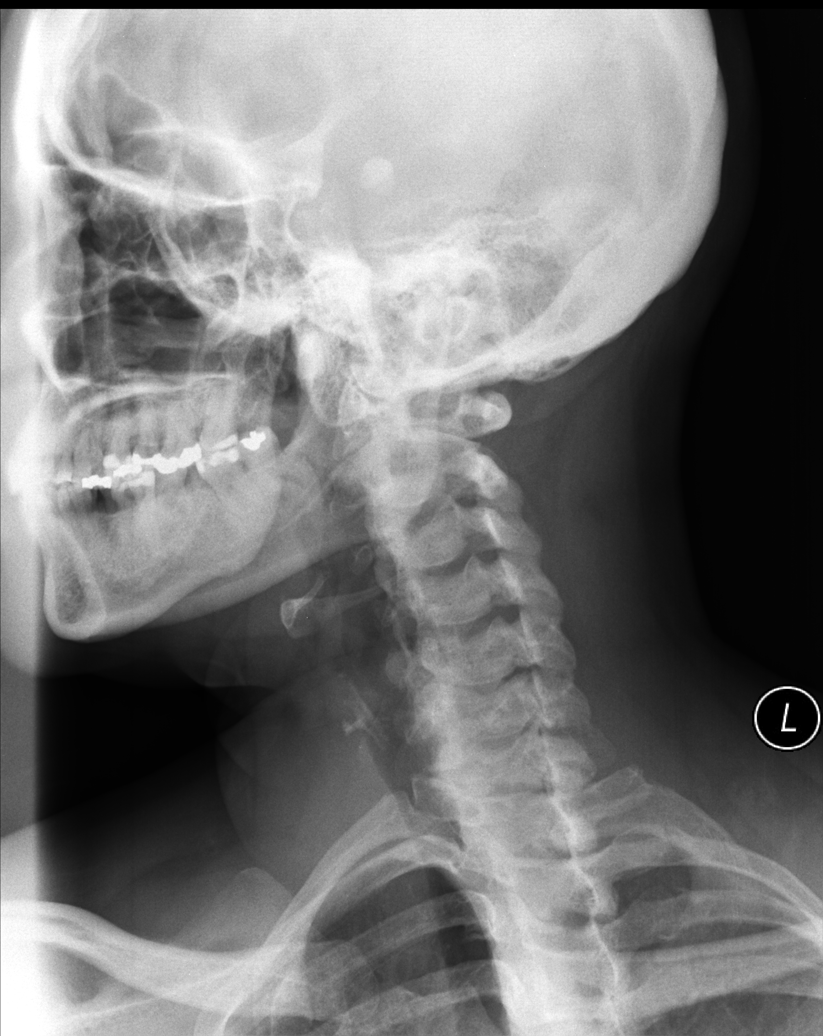

[AP]
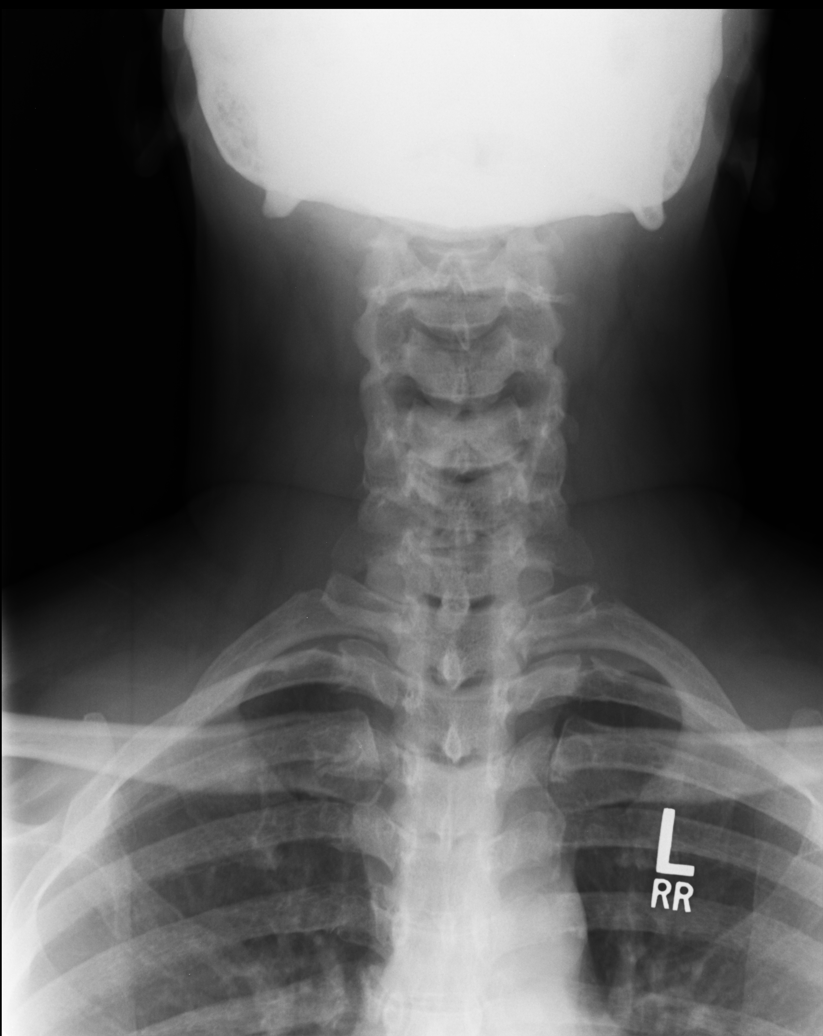

[ap open mouth]
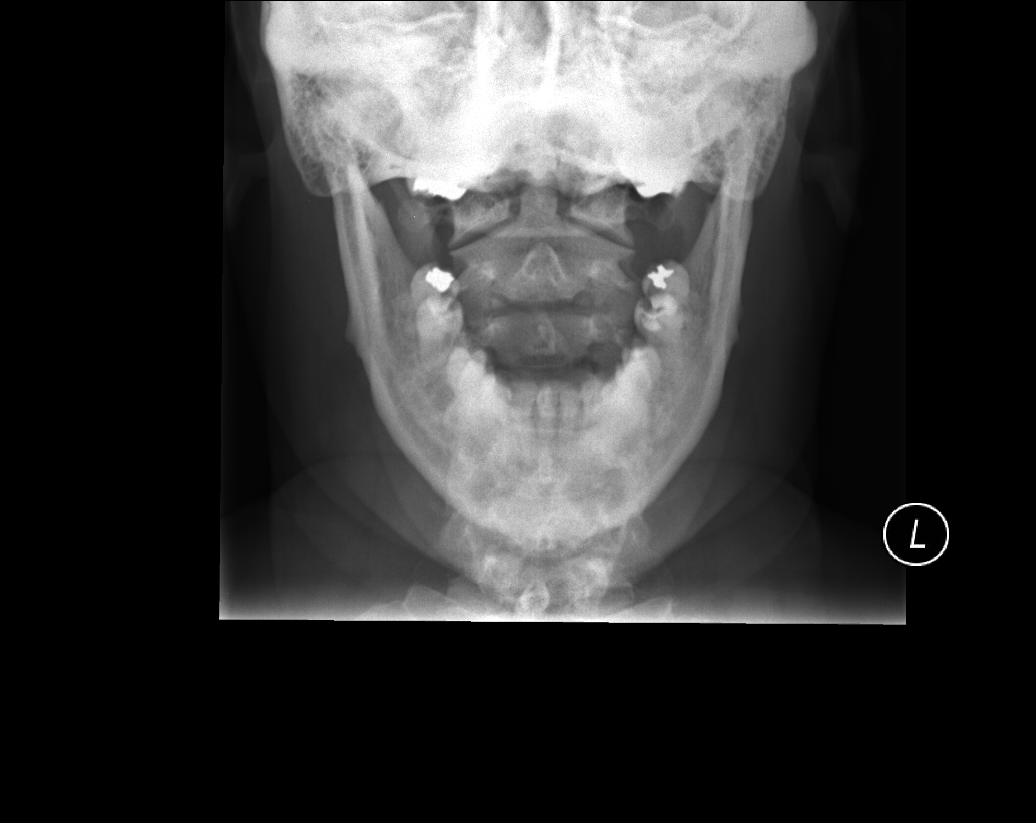

[5 of 5 positions shown; findings below may reference images not displayed]

FINDINGS: There is no evidence of cervical spine fracture or prevertebral soft
tissue swelling. Alignment is normal.

Mild to moderate degenerative disc disease is seen from levels of C4
to C7. Neural foramina and facet joints are not well profiled on
oblique projections. No other bone lesions identified.
IMPRESSION: No acute findings.  Degenerative spondylosis, as described above.

## 2016-12-28 ENCOUNTER — Ambulatory Visit (INDEPENDENT_AMBULATORY_CARE_PROVIDER_SITE_OTHER): Payer: Managed Care, Other (non HMO) | Admitting: Physician Assistant

## 2016-12-28 VITALS — BP 144/80 | HR 89 | Temp 98.9°F | Resp 18 | Ht 66.0 in | Wt 190.0 lb

## 2016-12-28 DIAGNOSIS — L0889 Other specified local infections of the skin and subcutaneous tissue: Secondary | ICD-10-CM | POA: Diagnosis not present

## 2016-12-28 DIAGNOSIS — L2489 Irritant contact dermatitis due to other agents: Secondary | ICD-10-CM

## 2016-12-28 MED ORDER — AMOXICILLIN-POT CLAVULANATE 875-125 MG PO TABS: 1.0000 | ORAL_TABLET | Freq: Two times a day (BID) | ORAL | 0 refills | Status: AC

## 2016-12-28 MED ORDER — HYDROXYZINE HCL 25 MG PO TABS: 12.5000 mg | ORAL_TABLET | Freq: Three times a day (TID) | ORAL | 0 refills | Status: DC | PRN

## 2016-12-28 MED ORDER — CETIRIZINE HCL 10 MG PO TABS: 10.0000 mg | ORAL_TABLET | Freq: Every day | ORAL | 11 refills | Status: DC

## 2016-12-28 MED ORDER — TRIAMCINOLONE ACETONIDE 0.1 % EX CREA: 1.0000 "application " | TOPICAL_CREAM | Freq: Two times a day (BID) | CUTANEOUS | 0 refills | Status: DC

## 2016-12-28 NOTE — Progress Notes (Signed)
[facility] [address], [place] [address] [phone]  Date:  12/28/2016   Name:  IBBIE MERE   DOB:  [birth year]   MRN:  [id number]  PCP:  Marina Goodell, MD   Chief Complaint  Patient presents with  . Rash    CHIN,CHEST ,LEFT ARM     History of Present Illness:  Beth Jimenez is a 51 y.o. female patient who presents to [facility] for cc of rash of chin, chest, and left arm.   Patient reports a rash that started on Wednesday, that is pruritic in character. Tiny bumps are worse on the left side of her chin. This then spread to the left side of her neck as well as her forearm. She attempted to put hydrogen peroxide on her chin. This now has a burning type of itching.  Patient has not changed any hygeine products or been outdoors, except the added use of rihanna's fleur perfume.  She has allergies to cinnamon and peppermint.  No oral swelling or sob or dyspnea.  No children contact or at the home.  Patient Active Problem List   Diagnosis Date Noted  . Essential hypertension 02/25/2016  . Scar 02/25/2016  . S/P laparoscopic assisted vaginal hysterectomy (LAVH) 11/15/2014  . Pelvic pain in female 11/14/2014  . Leiomyoma of uterus 11/14/2014  . Microcytosis 08/03/2014  . 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 Menorrhagia 07/03/2014    Past Medical History:  Diagnosis Date  . Abnormal Pap smear of cervix   . Anemia   . Blood transfusion without reported diagnosis   . Fibroids   . Heart murmur   . Hypertension   . 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 Microcytosis 08/03/2014    Past Surgical History:  Procedure Laterality Date  . ABDOMINAL HYSTERECTOMY    . CESAREAN SECTION    . LAPAROSCOPIC ASSISTED VAGINAL HYSTERECTOMY N/A 11/15/2014   Procedure: LAPAROSCOPIC ASSISTED VAGINAL HYSTERECTOMY;  Surgeon: Janyth Contes, MD;  Location: [facility];  Service: Gynecology;  Laterality: N/A;  vagina,    . MYOMECTOMY      Social History  Substance Use Topics  . Smoking status: Never Smoker  . Smokeless tobacco: Never Used  . Alcohol use No    Family History  Problem Relation Age of Onset  . Cancer Mother     lung ca  . Cancer Maternal Grandmother     lung ca    Allergies  Allergen Reactions  . Cinnamon Swelling  . Peppermint Oil Swelling  . Feraheme [Ferumoxytol] Hives    Medication list has been reviewed and updated.  Current Outpatient Prescriptions on File Prior to Visit  Medication Sig Dispense Refill  . amLODipine (NORVASC) 10 MG tablet Take 1 tablet (10 mg total) by mouth daily. 90 tablet 3  . cyclobenzaprine (FLEXERIL) 10 MG tablet Take 1 tablet (10 mg total) by mouth 3 (three) times daily as needed for muscle spasms. 30 tablet 0  . meloxicam (MOBIC) 7.5 MG tablet Take 1 tablet (7.5 mg total) by mouth daily. 30 tablet 0   No current facility-administered medications on file prior to visit.     ROS ROS otherwise unremarkable unless as above.  Physical Examination: BP (!) 144/80 (BP Location: Right Arm, Patient Position: Sitting, Cuff Size: Small)   Pulse 89   Temp 98.9 F (37.2 C) (Oral)   Resp 18   Ht 5\' 6"  (1.676 m)   Wt  190 lb (86.2 kg)   LMP 10/12/2014   SpO2 99%   BMI 30.67 kg/m  Ideal Body Weight: Weight in (lb) to have BMI = 25: 154.6  Physical Exam  Constitutional: She is oriented to person, place, and time. She appears well-developed and well-nourished. No distress.  HENT:  Head: Normocephalic and atraumatic.  Right Ear: External ear normal.  Left Ear: External ear normal.  Eyes: Conjunctivae and EOM are normal. Pupils are equal, round, and reactive to light.  Cardiovascular: Normal rate.   Pulmonary/Chest: Effort normal. No respiratory distress.  Neurological: She is alert and oriented to person, place, and time.  Skin: She is not diaphoretic.  Papules spotted along the anterior forearm of the left arm. Non-erythematous or tender. More  papules can be seen on the left side of her anterior thoracic that spread up the neck. There is also patching. No drainage detected. Left side of chin with papular and honey crusted rash. Nontender.  Psychiatric: She has a normal mood and affect. Her behavior is normal.     Assessment and Plan: NATANE DISANTIS is a 51 y.o. female who is here today for cc of rash.   Possible secondary infection of the chin. We will treat this with Augmentin. I'm prescribing triamcinolone acetonide for what appears to be some sort of contact dermatitis likely by the fragrance that she used.  In review of perfume, there is a hexyl cinnamate, and methylcinna... Advised to avoid the trigger. Given hydroxyzine for pruritic symptoms. Also advised to use cetirizine daily at this time. She will follow-up in 1 week if sxs do not improve. Irritant contact dermatitis due to other agents - Plan: amoxicillin-clavulanate (AUGMENTIN) 875-125 MG tablet, triamcinolone cream (KENALOG) 0.1 %, cetirizine (ZYRTEC) 10 MG tablet, hydrOXYzine (ATARAX/VISTARIL) 25 MG tablet  Secondary infection of skin - Plan: amoxicillin-clavulanate (AUGMENTIN) 875-125 MG tablet  Ivar Drape, PA-C [facility] 1/17/201811:19 AM

## 2016-12-28 NOTE — Patient Instructions (Addendum)
Please take the antibiotic. Apply the cream no longer than one week. Follow up in 7 days if no improvement.  Sooner if it worsens.   Vistaril will make you tired, so take with caution.  Contact Dermatitis Introduction Dermatitis is redness, soreness, and swelling (inflammation) of the skin. Contact dermatitis is a reaction to certain substances that touch the skin. You either touched something that irritated your skin, or you have allergies to something you touched. Follow these instructions at home: [place] your skin as needed.  Apply cool compresses to the affected areas.  Try taking a bath with:  Epsom salts. Follow the instructions on the package. You can get these at a pharmacy or grocery store.  Baking soda. Pour a small amount into the bath as told by your doctor.  Colloidal oatmeal. Follow the instructions on the package. You can get this at a pharmacy or grocery store.  Try applying baking soda paste to your skin. Stir water into baking soda until it looks like paste.  Do not scratch your skin.  Bathe less often.  Bathe in lukewarm water. Avoid using hot water. Medicines  Take or apply over-the-counter and prescription medicines only as told by your doctor.  If you were prescribed an antibiotic medicine, take or apply your antibiotic as told by your doctor. Do not stop taking the antibiotic even if your condition starts to get better. General instructions  Keep all follow-up visits as told by your doctor. This is important.  Avoid the substance that caused your reaction. If you do not know what caused it, keep a journal to try to track what caused it. Write down:  What you eat.  What cosmetic products you use.  What you drink.  What you wear in the affected area. This includes jewelry.  If you were given a bandage (dressing), take care of it as told by your doctor. This includes when to change and remove it. Contact a doctor if:  You do not  get better with treatment.  Your condition gets worse.  You have signs of infection such as:  Swelling.  Tenderness.  Redness.  Soreness.  Warmth.  You have a fever.  You have new symptoms. Get help right away if:  You have a very bad headache.  You have neck pain.  Your neck is stiff.  You throw up (vomit).  You feel very sleepy.  You see red streaks coming from the affected area.  Your bone or joint underneath the affected area becomes painful after the skin has healed.  The affected area turns darker.  You have trouble breathing. This information is not intended to replace advice given to you by your health care provider. Make sure you discuss any questions you have with your health care provider. Document Released: 09/27/2009 Document Revised: 05/07/2016 Document Reviewed: 04/17/2015  2017 Elsevier     IF you received an x-ray today, you will receive an invoice from [facility]. Please contact [facility] at [phone] with questions or concerns regarding your invoice.   IF you received labwork today, you will receive an invoice from [place]. Please contact [facility] at [phone] with questions or concerns regarding your invoice.   Our billing staff will not be able to assist you with questions regarding bills from these companies.  You will be contacted with the lab results as soon as they are available. The fastest way to get your results is to activate your My Chart account. Instructions are located  on the last page of this paperwork. If you have not heard from [place] regarding the results in 2 weeks, please contact this office.

## 2017-03-31 ENCOUNTER — Encounter: Payer: Managed Care, Other (non HMO) | Admitting: Student

## 2017-05-03 ENCOUNTER — Encounter: Payer: Managed Care, Other (non HMO) | Admitting: Student

## 2017-05-27 ENCOUNTER — Encounter: Payer: Managed Care, Other (non HMO) | Admitting: Student

## 2017-06-13 ENCOUNTER — Other Ambulatory Visit: Payer: Self-pay | Admitting: Physician Assistant

## 2017-06-13 DIAGNOSIS — L2489 Irritant contact dermatitis due to other agents: Secondary | ICD-10-CM

## 2017-06-30 ENCOUNTER — Other Ambulatory Visit: Payer: Self-pay | Admitting: Obstetrics and Gynecology

## 2017-06-30 DIAGNOSIS — N644 Mastodynia: Secondary | ICD-10-CM

## 2017-07-05 ENCOUNTER — Ambulatory Visit
Admission: RE | Admit: 2017-07-05 | Discharge: 2017-07-05 | Disposition: A | Discharge status: Home / Self Care | Payer: Managed Care, Other (non HMO) | Source: Ambulatory Visit | Attending: Obstetrics and Gynecology | Admitting: Obstetrics and Gynecology

## 2017-07-05 DIAGNOSIS — N644 Mastodynia: Secondary | ICD-10-CM

## 2017-07-26 ENCOUNTER — Encounter: Payer: Self-pay | Admitting: Family Medicine

## 2017-07-26 ENCOUNTER — Ambulatory Visit (INDEPENDENT_AMBULATORY_CARE_PROVIDER_SITE_OTHER): Payer: Managed Care, Other (non HMO) | Admitting: Family Medicine

## 2017-07-26 VITALS — BP 164/100 | HR 100 | Temp 98.3°F | Ht 66.0 in | Wt 187.0 lb

## 2017-07-26 DIAGNOSIS — I10 Essential (primary) hypertension: Secondary | ICD-10-CM | POA: Diagnosis not present

## 2017-07-26 DIAGNOSIS — E669 Obesity, unspecified: Secondary | ICD-10-CM

## 2017-07-26 DIAGNOSIS — Z6830 Body mass index (BMI) 30.0-30.9, adult: Secondary | ICD-10-CM

## 2017-07-26 DIAGNOSIS — E66811 Obesity, class 1: Secondary | ICD-10-CM

## 2017-07-26 MED ORDER — HYDROCHLOROTHIAZIDE 12.5 MG PO TABS: 12.5000 mg | ORAL_TABLET | Freq: Every day | ORAL | 0 refills | Status: DC

## 2017-07-26 NOTE — Patient Instructions (Signed)
It was good to see you today!  For your blood pressure, - Start HCTZ 12.5mg  daily - Things to watch for your for lightheadedness, fainting   Please check-out at the front desk before leaving the clinic. Make an appointment in 2 week for recheck.  We are checking some labs today. If results require attention, either myself or my nurse will get in touch with you. If everything is normal, you will get a letter in the mail or a message in My Chart. Please give [place] a call if you do not hear from [place] after 2 weeks.  Please bring all of your medications with you to each visit.   Sign up for My Chart to have easy access to your labs results, and communication with your primary care physician.  Feel free to call with any questions or concerns at any time, at [phone].   Take care,  Dr. Bufford Lope, [place]

## 2017-07-26 NOTE — Progress Notes (Signed)
    Subjective:  Beth Jimenez is a 51 y.o. female who presents to the [facility] today for CPE.   HPI:  HTN - Was previous on norvasc in the past but did not tolerate due to headaches/lightheadedness and hair loss - Has been off medications for awhile because had made of healthy lifestyle changes but has recently gained back weight after stopping exercising. Plans to start walking again - Having some headaches so think BP has been high at homr  - No CP, SOB. Does have some swelling of hands but not legs and only after eating late at night  Abd pain - Has occasional sharp b/l abd pain once every 2-3 weeks that lasts for few seconds and relieved with having BM or passing gas. No abdominal pain today. - No distention, n/v, diarrhea. Sometimes has constipation but no blood in stool  PMH: HTN, previously had anemia but told resolved after hysterectomy in 2015 Social hx: no ETOH or tobacco or recreational drugs. No regular exercise currently but plans to restart walking daily   ROS: Per HPI, otherwise all other systems reviewed and neg  Objective:  Physical Exam: BP (!) 170/100   Pulse 100   Temp 98.3 F (36.8 C) (Oral)   Ht 5\' 6"  (1.676 m)   Wt 187 lb (84.8 kg)   LMP 10/12/2014   SpO2 98%   BMI 30.18 kg/m  BP recheck 164/100 Gen: NAD, resting comfortably CV: RRR with no murmurs appreciated Pulm: NWOB, CTAB with no crackles, wheezes, or rhonchi GI: Normal bowel sounds present. Soft, Nontender, Nondistended. MSK: no edema, cyanosis, or clubbing noted Skin: warm, dry Neuro: grossly normal, moves all extremities Psych: Normal affect and thought content  Assessment/Plan:  Essential hypertension Uncontrolled. Start HCTZ 12.5mg  qd and check BMP today. Follow up in 2 weeks to recheck BP and if tolerating okay, will recheck BMP at that time.  Abdominal pain  Likely gas pains given relieved with flatus or BM. Benign abdominal exam today and no red flags on history.  Health maintenance    - TDAP: Per patient given in 2015 at [facility] office, will look through patient chart to confirm - Pap: scheduled for Monday with OBGYN - Colonoscopy: Per patient done in 2015 pre-hysterectomy to work up for anemia. Will get ROI to obtain records.  Bufford Lope, DO PGY-2, [facility] 07/26/2017 8:54 AM

## 2017-07-26 NOTE — Assessment & Plan Note (Signed)
Uncontrolled. Start HCTZ 12.5mg  qd and check BMP today. Follow up in 2 weeks to recheck BP and if tolerating okay, will recheck BMP at that time.

## 2017-07-27 LAB — BASIC METABOLIC PANEL
BUN / CREAT RATIO: 9 (ref 9–23)
BUN: 9 mg/dL (ref 6–24)
CO2: 22 mmol/L (ref 20–29)
CREATININE: 0.96 mg/dL (ref 0.57–1.00)
Calcium: 9.4 mg/dL (ref 8.7–10.2)
Chloride: 104 mmol/L (ref 96–106)
GFR, EST AFRICAN AMERICAN: 79 mL/min/{1.73_m2} (ref >59)
GFR, EST NON AFRICAN AMERICAN: 69 mL/min/{1.73_m2} (ref >59)
Glucose: 101 mg/dL — ABNORMAL HIGH (ref 65–99)
Potassium: 3.9 mmol/L (ref 3.5–5.2)
SODIUM: 141 mmol/L (ref 134–144)

## 2017-07-27 LAB — CBC
HEMATOCRIT: 35.5 % (ref 34.0–46.6)
HEMOGLOBIN: 11.5 g/dL (ref 11.1–15.9)
MCH: 22.5 pg — AB (ref 26.6–33.0)
MCHC: 32.4 g/dL (ref 31.5–35.7)
MCV: 69 fL — ABNORMAL LOW (ref 79–97)
Platelets: 357 10*3/uL (ref 150–379)
RBC: 5.12 x10E6/uL (ref 3.77–5.28)
RDW: 16.6 % — ABNORMAL HIGH (ref 12.3–15.4)
WBC: 5.9 10*3/uL (ref 3.4–10.8)

## 2017-07-27 LAB — LIPID PANEL
CHOL/HDL RATIO: 3.6 ratio (ref 0.0–4.4)
Cholesterol, Total: 184 mg/dL (ref 100–199)
HDL: 51 mg/dL (ref >39)
LDL CALC: 118 mg/dL — AB (ref 0–99)
Triglycerides: 73 mg/dL (ref 0–149)
VLDL Cholesterol Cal: 15 mg/dL (ref 5–40)

## 2017-07-27 LAB — VITAMIN D 25 HYDROXY (VIT D DEFICIENCY, FRACTURES): VIT D 25 HYDROXY: 35.4 ng/mL (ref 30.0–100.0)

## 2017-07-28 ENCOUNTER — Encounter: Payer: Self-pay | Admitting: Family Medicine

## 2017-08-02 ENCOUNTER — Encounter: Payer: Self-pay | Admitting: Family Medicine

## 2017-08-02 NOTE — Progress Notes (Signed)
Per ROI from Dr. Lorie Apley office, no colonoscopy done in 2015.

## 2017-08-09 ENCOUNTER — Ambulatory Visit (INDEPENDENT_AMBULATORY_CARE_PROVIDER_SITE_OTHER): Payer: Managed Care, Other (non HMO) | Admitting: Family Medicine

## 2017-08-09 ENCOUNTER — Encounter: Payer: Self-pay | Admitting: Family Medicine

## 2017-08-09 VITALS — BP 152/90 | HR 93 | Temp 98.5°F | Wt 185.0 lb

## 2017-08-09 DIAGNOSIS — Z23 Encounter for immunization: Secondary | ICD-10-CM

## 2017-08-09 DIAGNOSIS — I10 Essential (primary) hypertension: Secondary | ICD-10-CM | POA: Diagnosis not present

## 2017-08-09 MED ORDER — HYDROCHLOROTHIAZIDE 25 MG PO TABS: 25.0000 mg | ORAL_TABLET | Freq: Every day | ORAL | 0 refills | Status: DC

## 2017-08-09 NOTE — Progress Notes (Signed)
    Subjective:  Beth Jimenez is a 51 y.o. female who presents to the [facility] today for HTN follow up.   HPI:  Hypertension  - Started on HCTZ 12.5mg  qd 2 weeks ago, tolerating well, compliant all the time - Does not check BP at home but at [facility] office was elevated, does not remember SBP but recalls that DBP in 100s.  -  ROS: taking medications as instructed, no medication side effects noted, patient does not perform home BP monitoring, no TIA's, no chest pain on exertion, no dyspnea on exertion, no swelling of ankles and no orthostatic dizziness or lightheadedness. No HA.  ROS: Per HPI  Objective:  Physical Exam: BP (!) 152/90   Pulse 93   Temp 98.5 F (36.9 C) (Oral)   Wt 185 lb (83.9 kg)   LMP 10/12/2014   SpO2 97%   BMI 29.86 kg/m  BP manual recheck 160/100 Gen: NAD, resting comfortably CV: RRR with no murmurs appreciated Pulm: NWOB, CTAB with no crackles, wheezes, or rhonchi GI: Normal bowel sounds present. Soft, Nontender, Nondistended. MSK: no edema, cyanosis, or clubbing noted Skin: warm, dry Neuro: grossly normal, moves all extremities Psych: Normal affect and thought content  Assessment/Plan:  Essential hypertension Improved but still uncontrolled and not at goal. Increase HCTZ to 25mg  qd since she is tolerating well. Will have patient return for lab visit in 2 weeks to recheck BMP. Discussed checking BP at home and patient will send in home reading via my chart. Also discussed symptoms of hypotension and that if this should happen that patient would return for follow up appointment sooner but otherwise if all looks well with home BP readings and labwork then plan for 3 month follow up per patient request.  Health maintenance  - TDAP given today  - pap smear done at [facility], patient requested for them to send [place] a copy - colonoscopy likely done at Dr. Lorie Apley office but no record of such in 2015, will see if done in a different year  Bufford Lope, DO PGY-2, [facility] 08/09/2017 10:01 AM

## 2017-08-09 NOTE — Patient Instructions (Signed)
It was good to see you today!  For your blood pressure, - Check your blood pressure at home on the increased dose of HCTZ 25mg  daily, you can send me a message on my chart with your home readings - Come back for a lab visit in 2-4 weeks to check bloodwork given the increased dose of medication. If results require attention, either myself or my nurse will get in touch with you. If everything is normal, you will get a letter in the mail or a message in My Chart. Please give [place] a call if you do not hear from [place] after 2 weeks.  - If everything looks good with both the home BP and the bloodwork I will see you back in 3 months for follow up.    Feel free to call with any questions or concerns at any time, at [phone].   Take care,  Dr. Bufford Lope, [place]

## 2017-08-10 NOTE — Assessment & Plan Note (Signed)
Improved but still uncontrolled and not at goal. Increase HCTZ to 25mg  qd since she is tolerating well. Will have patient return for lab visit in 2 weeks to recheck BMP. Discussed checking BP at home and patient will send in home reading via my chart. Also discussed symptoms of hypotension and that if this should happen that patient would return for follow up appointment sooner but otherwise if all looks well with home BP readings and labwork then plan for 3 month follow up per patient request.

## 2017-08-11 ENCOUNTER — Encounter: Payer: Self-pay | Admitting: Family Medicine

## 2017-09-07 ENCOUNTER — Other Ambulatory Visit: Payer: Self-pay | Admitting: Family Medicine

## 2017-09-07 DIAGNOSIS — I10 Essential (primary) hypertension: Secondary | ICD-10-CM

## 2017-09-08 ENCOUNTER — Encounter: Payer: Self-pay | Admitting: Family Medicine

## 2017-09-08 NOTE — Telephone Encounter (Signed)
Patient needs to come in for BP recheck and bloodwork since we increased her medication. BMP was ordered last time as a future collect.  Will send patient message via mychart to remind her of this.

## 2017-09-27 ENCOUNTER — Encounter: Payer: Self-pay | Admitting: Family Medicine

## 2017-10-20 ENCOUNTER — Encounter: Payer: Self-pay | Admitting: Family Medicine

## 2017-10-20 ENCOUNTER — Ambulatory Visit: Payer: Managed Care, Other (non HMO) | Admitting: Family Medicine

## 2018-03-23 ENCOUNTER — Encounter: Payer: Self-pay | Admitting: Physician Assistant

## 2018-05-19 ENCOUNTER — Ambulatory Visit (INDEPENDENT_AMBULATORY_CARE_PROVIDER_SITE_OTHER): Payer: Managed Care, Other (non HMO) | Admitting: Internal Medicine

## 2018-05-19 ENCOUNTER — Other Ambulatory Visit: Payer: Self-pay

## 2018-05-19 ENCOUNTER — Encounter: Payer: Self-pay | Admitting: Internal Medicine

## 2018-05-19 DIAGNOSIS — I10 Essential (primary) hypertension: Secondary | ICD-10-CM | POA: Diagnosis not present

## 2018-05-19 MED ORDER — HYDROCHLOROTHIAZIDE 25 MG PO TABS: 25.0000 mg | ORAL_TABLET | Freq: Every day | ORAL | 0 refills | Status: DC

## 2018-05-19 MED ORDER — CLONIDINE HCL 0.1 MG PO TABS
0.1000 mg | ORAL_TABLET | Freq: Once | ORAL | Status: AC
  Administered 2018-05-19: 0.1 mg via ORAL

## 2018-05-19 NOTE — Progress Notes (Signed)
   [facility] Kerrin Mo, MD Phone: [phone]  Reason For Visit: Blood pressure   #HTN  Patient states that she was previously on blood pressure medication.  She improved her diet and lost weight and therefore was able to stop taking it.  She recently went to her dentist and was told her blood pressure was elevated. Denies  dyspnea, HA, edema, dizziness / lightheadedness Endorses having chest pain twinges- she notes these more recently in April.  Denies any symptoms of diaphoresis, nausea, shortness of breath associated with this.  Indicates that this pressure is relieved by taking off her bra, she believes it may be due to the tightness of the bra.  Non-smoker. No EtOH  Past Medical History Reviewed problem list.  Medications- reviewed and updated No additions to family history Social history- patient is a non-smoker  Objective: BP (!) 218/112   Pulse 89   Temp 98.9 F (37.2 C) (Oral)   Ht 5\' 6"  (1.676 m)   Wt 185 lb 12.8 oz (84.3 kg)   LMP 10/12/2014   SpO2 98%   BMI 29.99 kg/m  Gen: NAD, alert, cooperative with exam Cardio: regular rate and rhythm, S1S2 heard, no murmurs appreciated Pulm: clear to auscultation bilaterally, no wheezes, rhonchi or rales Extremities: warm, well perfused, No edema, cyanosis or clubbing;    Assessment/Plan: See problem based a/p  Essential hypertension Clonidine given today for elevated blood pressure in clinic HCTZ restarted BMET obtained  Follow up in 1 week

## 2018-05-19 NOTE — Assessment & Plan Note (Addendum)
Clonidine given today for elevated blood pressure in clinic HCTZ restarted BMET obtained  Follow up in 1 week

## 2018-05-19 NOTE — Patient Instructions (Signed)
I am going to restart your hydrochlorothiazide.  Please follow-up in 1 week to recheck your blood pressure.

## 2018-05-20 LAB — BASIC METABOLIC PANEL
BUN/Creatinine Ratio: 10 (ref 9–23)
BUN: 10 mg/dL (ref 6–24)
CO2: 25 mmol/L (ref 20–29)
CREATININE: 0.96 mg/dL (ref 0.57–1.00)
Calcium: 9.5 mg/dL (ref 8.7–10.2)
Chloride: 101 mmol/L (ref 96–106)
GFR calc Af Amer: 79 mL/min/{1.73_m2} (ref >59)
GFR, EST NON AFRICAN AMERICAN: 68 mL/min/{1.73_m2} (ref >59)
Glucose: 98 mg/dL (ref 65–99)
Potassium: 4.2 mmol/L (ref 3.5–5.2)
SODIUM: 139 mmol/L (ref 134–144)

## 2018-05-24 ENCOUNTER — Encounter: Payer: Self-pay | Admitting: Internal Medicine

## 2018-05-25 ENCOUNTER — Encounter: Payer: Self-pay | Admitting: Internal Medicine

## 2018-07-29 ENCOUNTER — Other Ambulatory Visit: Payer: Self-pay | Admitting: Internal Medicine

## 2018-07-29 DIAGNOSIS — I10 Essential (primary) hypertension: Secondary | ICD-10-CM

## 2018-08-22 ENCOUNTER — Encounter: Payer: Self-pay | Admitting: Family Medicine

## 2018-08-22 ENCOUNTER — Other Ambulatory Visit: Payer: Self-pay

## 2018-08-22 ENCOUNTER — Ambulatory Visit (INDEPENDENT_AMBULATORY_CARE_PROVIDER_SITE_OTHER): Payer: Managed Care, Other (non HMO) | Admitting: Family Medicine

## 2018-08-22 ENCOUNTER — Ambulatory Visit: Payer: Managed Care, Other (non HMO) | Admitting: Psychology

## 2018-08-22 VITALS — BP 118/80 | HR 90 | Temp 98.5°F | Ht 65.0 in | Wt 180.0 lb

## 2018-08-22 DIAGNOSIS — J302 Other seasonal allergic rhinitis: Secondary | ICD-10-CM | POA: Diagnosis not present

## 2018-08-22 DIAGNOSIS — I10 Essential (primary) hypertension: Secondary | ICD-10-CM | POA: Diagnosis not present

## 2018-08-22 DIAGNOSIS — F439 Reaction to severe stress, unspecified: Secondary | ICD-10-CM | POA: Insufficient documentation

## 2018-08-22 DIAGNOSIS — R252 Cramp and spasm: Secondary | ICD-10-CM | POA: Diagnosis not present

## 2018-08-22 DIAGNOSIS — Z23 Encounter for immunization: Secondary | ICD-10-CM | POA: Diagnosis not present

## 2018-08-22 MED ORDER — FEXOFENADINE HCL 60 MG PO TABS: 60.0000 mg | ORAL_TABLET | Freq: Two times a day (BID) | ORAL | 3 refills | Status: AC

## 2018-08-22 MED ORDER — HYDROCHLOROTHIAZIDE 25 MG PO TABS: 25.0000 mg | ORAL_TABLET | Freq: Every day | ORAL | 3 refills | Status: DC

## 2018-08-22 NOTE — Assessment & Plan Note (Signed)
Well controlled and doing well on hydrochlorothiazide 25 mg daily.  She has had recent blood work showing normal electrolytes.  Continue current management.  Refill given today.

## 2018-08-22 NOTE — Progress Notes (Signed)
Subjective:  Beth Jimenez is a 52 y.o. female who presents to the [facility] today for annual wellness visit with a few complaints of stress and allergies and abdominal pain  HPI:  Has been having issues with stress in her life, both at work and at home.  Her job is shutting down and she knows that she will find a new job in the future but it is still stressful.  She has been coping by trying to take some moments for herself and keeping on top of her work.  Her stress at home is due to her husband who has PTSD.  She has gone to his counseling appointment and is asked if there are any support groups otherwise in her situation but they are done locally that she knows of.  She tries to walk outside which helps. She has also noticed some seasonal allergies particularly bad this year.  She does not have them all year round.    She states that she is due for her yearly Pap smear that she receives from Dr. Melba Coon.  She has been having flashes lately.  She does notice a couple of twinges of left lower abdominal pain that have been 1-2 times per month is very mild and self-limited.  Does not review with her life in any way.  She thinks it may be muscle cramps and that she may need to drink more water.   ROS: Per HPI, otherwise all systems reviewed and negative  PMH:  The following were reviewed and entered/updated in epic: Past Medical History:  Diagnosis Date  . Abnormal Pap smear of cervix   . Anemia   . Blood transfusion without reported diagnosis   . Fibroids   . Heart murmur   . Hypertension   . 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 Microcytosis 08/03/2014   Patient Active Problem List   Diagnosis Date Noted  . Essential hypertension 02/25/2016  . Scar 02/25/2016  . S/P laparoscopic assisted vaginal hysterectomy (LAVH) 11/15/2014  . Microcytosis 08/03/2014  . 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 CESAREAN SECTION    . LAPAROSCOPIC ASSISTED VAGINAL HYSTERECTOMY N/A 11/15/2014   Procedure: LAPAROSCOPIC ASSISTED VAGINAL HYSTERECTOMY;  Surgeon: Janyth Contes, MD;  Location: [facility];  Service: Gynecology;  Laterality: N/A;  vagina,   . MYOMECTOMY      Social History   Socioeconomic History  . Marital status: Married    Spouse name: Not on file  . Number of children: Not on file  . Years of education: Not on file  . Highest education level: Not on file  Occupational History  . Not on file  Social Needs  . Financial resource strain: Not on file  . Food insecurity:    Worry: Not on file    Inability: Not on file  . Transportation needs:    Medical: Not on file    Non-medical: Not on file  Tobacco Use  . Smoking status: Never Smoker  . Smokeless tobacco: Never Used  Substance and Sexual Activity  . Alcohol use: No  . Drug use: No  . Sexual activity: Yes  Lifestyle  . Physical activity:    Days per week: Not on file    Minutes per session: Not on file  . Stress: Not on file  Relationships  . Social connections:    Talks on phone: Not on  file    Gets together: Not on file    Attends religious service: Not on file    Active member of club or organization: Not on file    Attends meetings of clubs or organizations: Not on file    Relationship status: Not on file  Other Topics Concern  . Not on file  Social History Narrative   Regular exercise in the past was walking     Objective:  Physical Exam: BP 118/80   Pulse 90   Temp 98.5 F (36.9 C) (Oral)   Ht 5\' 5"  (1.651 m)   Wt 180 lb (81.6 kg)   LMP 10/12/2014   SpO2 97%   BMI 29.95 kg/m   Gen: NAD, resting comfortably HEENT: [place], AT. EOMI. oropharynx unremarkable.  Nasal mucosa healthy. Neck: Supple, no lymphadenopathy, no thyromegaly. CV: RRR with no murmurs appreciated Pulm: NWOB, CTAB with no crackles, wheezes, or rhonchi GI: Normal bowel sounds present. Soft,  Nontender, Nondistended. MSK: no edema, cyanosis, or clubbing noted Skin: warm, dry Neuro: grossly normal, moves all extremities Psych: Normal affect and thought content   Assessment/Plan:  Essential hypertension Well controlled and doing well on hydrochlorothiazide 25 mg daily.  She has had recent blood work showing normal electrolytes.  Continue current management.  Refill given today.  Seasonal allergies Advised trial of Allegra, patient will obtain this over-the-counter.  Stress Discussed that is having a normal reaction to stressful life events.  No role for medication.  Patient would like to try counseling.  Warm handoff to [facility] today.  Muscle cramp Area that patient described as abdominal pain is actually lower in L groin area. Benign abdominal exam and no pain today.  Reassured and advised good water intake.  Advised watchful monitoring at home as there are no red flags on history or exam today.  Bufford Lope, DO PGY-3, [facility] 08/22/2018 2:48 PM

## 2018-08-22 NOTE — Patient Instructions (Addendum)
Get allegra over the counter for your seasonal allergies.  Things to do to keep yourself healthy  - Exercise at least 30-45 minutes a day, 3-4 days a week.  - Eat a low-fat diet with lots of fruits and vegetables, up to 7-9 servings per day.  - Seatbelts can save your life. Wear them always.  - Smoke detectors on every level of your home, check batteries every year.  - Eye Doctor - have an eye exam every 1-2 years  - Safe sex - if you may be exposed to STDs, use a condom.  - Alcohol -  If you drink, do it moderately, less than 2 drinks per day.  - [place]. Choose someone to speak for you if you are not able.  - Depression is common in our stressful world.If you're feeling down or losing interest in things you normally enjoy, please come in for a visit.  - Violence - If anyone is threatening or hurting you, please call immediately.

## 2018-08-22 NOTE — Assessment & Plan Note (Signed)
Discussed that is having a normal reaction to stressful life events.  No role for medication.  Patient would like to try counseling.  Warm handoff to [facility] today.

## 2018-08-22 NOTE — Assessment & Plan Note (Signed)
Patient is neatly groomed and appropriately dressed. She maintains good eye contact and is cooperative and attentive.  Speech is normal in tone, rate and rhythm. Neutral mood and affect. Thought process is logical and goal directed. Did not assess suicidal ideation. Does not appear to be responding to any internal stimuli. Able to maintain train of thought and concentrate on the questions. Judgment and insight are average.  [facility] intern provided information on assertive communication. Patient reported understanding of and motivation to use strategy to communicate her thoughts and feelings to husband to better deal with interpersonal stress. Patient plans to call to schedule FU appointment as needed.

## 2018-08-22 NOTE — Assessment & Plan Note (Signed)
Advised trial of Allegra, patient will obtain this over-the-counter.

## 2018-08-22 NOTE — Progress Notes (Signed)
Dr. Shawna Orleans requested a [place] Intern met with patient for 20 minutes during warm hand-off.   Presenting Issue: Patient reported feeling stress and negative mood related to interpersonal difficulties with her husband. Patient reported that she wanted coping strategies to deal with stress.    Report of symptoms: Patient reported difficulty communicating her thoughts and emotions to her husband and general stress during interactions with him.   Impact on function: Stress has created strain in her relationship with her husband. No other impact on function assessed.   Psychiatric History - Diagnoses: Not assessed.  - Hospitalizations:Not assessed. - Pharmacotherapy: Not assessed.  - Outpatient therapy: Not assessed.   Family history of psychiatric issues: Not assessed.   Current and history of substance use: Not assessed.   Medical conditions that might explain or contribute to symptoms: Not assessed.  Assessment / Plan / Recommendations: [facility] intern provided information and handouts on assertive communication in order to share thoughts and feelings assertively and maintain a positive relationship with her husband. Patient expressed interest in continued [facility] appointments for additional counseling on stress coping skills, however her work times create a barrier for regular appointments. Patient will contact office to schedule FU appointments based on her schedule.

## 2018-09-02 ENCOUNTER — Encounter: Payer: Self-pay | Admitting: Family Medicine

## 2019-02-06 ENCOUNTER — Encounter: Payer: Self-pay | Admitting: Family Medicine

## 2019-02-06 ENCOUNTER — Ambulatory Visit (INDEPENDENT_AMBULATORY_CARE_PROVIDER_SITE_OTHER): Payer: Self-pay | Admitting: Family Medicine

## 2019-02-06 ENCOUNTER — Other Ambulatory Visit: Payer: Self-pay

## 2019-02-06 VITALS — BP 132/74 | Temp 98.6°F | Wt 176.0 lb

## 2019-02-06 DIAGNOSIS — D500 Iron deficiency anemia secondary to blood loss (chronic): Secondary | ICD-10-CM

## 2019-02-06 DIAGNOSIS — E669 Obesity, unspecified: Secondary | ICD-10-CM

## 2019-02-06 DIAGNOSIS — R1030 Lower abdominal pain, unspecified: Secondary | ICD-10-CM | POA: Insufficient documentation

## 2019-02-06 DIAGNOSIS — I10 Essential (primary) hypertension: Secondary | ICD-10-CM

## 2019-02-06 DIAGNOSIS — Z6830 Body mass index (BMI) 30.0-30.9, adult: Secondary | ICD-10-CM

## 2019-02-06 LAB — POCT URINALYSIS DIP (MANUAL ENTRY)
Bilirubin, UA: NEGATIVE
Glucose, UA: NEGATIVE mg/dL
Leukocytes, UA: NEGATIVE
Nitrite, UA: NEGATIVE
SPEC GRAV UA: 1.02 (ref 1.010–1.025)
UROBILINOGEN UA: 0.2 U/dL
pH, UA: 7 (ref 5.0–8.0)

## 2019-02-06 LAB — POCT UA - MICROSCOPIC ONLY

## 2019-02-06 MED ORDER — HYDROCHLOROTHIAZIDE 25 MG PO TABS: 25.0000 mg | ORAL_TABLET | Freq: Every day | ORAL | 3 refills | Status: DC

## 2019-02-06 MED ORDER — HYDROCHLOROTHIAZIDE 25 MG PO TABS: 25.0000 mg | ORAL_TABLET | Freq: Every day | ORAL | 3 refills | Status: AC

## 2019-02-06 NOTE — Assessment & Plan Note (Signed)
Uncertain etiology.  No red flags on history or exam.  Possibly musculoskeletal given that started after a workout at the gym but not reproducible on exam with any palpation or lower extremity movements.  Her abdominal exam is benign.  Advised a trial of simethicone.  Discussed return precautions including worsening, associated symptoms with nausea vomiting diarrhea, fevers, urinary symptoms.

## 2019-02-06 NOTE — Progress Notes (Signed)
    Subjective:  Beth Jimenez is a 53 y.o. female who presents to the [facility] today with a chief complaint of abdominal pain.   HPI:  Patient states that she has had bilateral lower abdominal pain for last 2 to 3 weeks.  It started gradually after working at the gym when she did have an leg day.  It feels like a pulling sensation in the lower part of both sides of her abdomen/groin area.  She has not had any nausea, vomiting, diarrhea, blood in her stool, vaginal pain/discharge, dysuria, urgency, frequency.  She has not had any night sweats or weight loss.  She is eating and drinking well. She initially thought that this was due to muscle strain so she refrained from working out however it is not improved.  It occurs 1-2 times per day.  Lasts about 5 minutes.  At its worst rates about 6 or 7 out of 10.  There are no exacerbating or remitting factors.  Hypertension Compliant and doing well on hydrochlorothiazide 25 mg daily.  Tolerating well. No chest pain, lightheadedness, dizziness, shortness of breath, lower extremity edema  ROS: Per HPI   Objective:  Physical Exam: BP 132/74   Temp 98.6 F (37 C) (Oral)   Wt 176 lb (79.8 kg)   LMP 10/12/2014   BMI 29.29 kg/m   Gen: NAD, resting comfortably CV: RRR with no murmurs appreciated Pulm: NWOB, CTAB with no crackles, wheezes, or rhonchi GI: Normal bowel sounds present. Soft, Nontender, Nondistended.  No rebound or guarding MSK: no edema, cyanosis, or clubbing noted.  No pain with [place]y Sox or FADIR or straight leg or flexion.  No tenderness over bilateral hips or groin.  Normal strength in her lower extremities. Skin: warm, dry Neuro: grossly normal, moves all extremities Psych: Normal affect and thought content  Results for orders placed or performed in visit on 02/06/19 (from the past 72 hour(s))  POCT urinalysis dipstick     Status: Abnormal   Collection Time: 02/06/19  2:40 PM  Result Value Ref Range   Color, UA yellow yellow   Clarity, UA clear clear   Glucose, UA negative negative mg/dL   Bilirubin, UA negative negative   Ketones, POC UA trace (5) (A) negative mg/dL   Spec Grav, UA 1.020 1.010 - 1.025   Blood, UA trace-lysed (A) negative   pH, UA 7.0 5.0 - 8.0   Protein Ur, POC trace (A) negative mg/dL   Urobilinogen, UA 0.2 0.2 or 1.0 E.U./dL   Nitrite, UA Negative Negative   Leukocytes, UA Negative Negative     Assessment/Plan:  Lower abdominal pain Uncertain etiology.  No red flags on history or exam.  Possibly musculoskeletal given that started after a workout at the gym but not reproducible on exam with any palpation or lower extremity movements.  Her abdominal exam is benign.  Advised a trial of simethicone.  Discussed return precautions including worsening, associated symptoms with nausea vomiting diarrhea, fevers, urinary symptoms.  Essential hypertension Stable and doing well.  Continue hydrochlorothiazide 25 mg daily.  Check labs today   Bufford Lope, DO PGY-3, [facility] 02/06/2019 2:47 PM

## 2019-02-06 NOTE — Patient Instructions (Signed)
It was good to see you today!  Let's try over the counter gas X. If your abdominal pain is not better or gets worse let me know  We are checking some labs today. If results require attention, either myself or my nurse will get in touch with you. If everything is normal, you will get a letter in the mail or a message in My Chart. Please give [place] a call if you do not hear from [place] after 2 weeks.     Feel free to call with any questions or concerns at any time, at [phone].   Take care,  Dr. Bufford Lope, [place]

## 2019-02-06 NOTE — Assessment & Plan Note (Signed)
Stable and doing well.  Continue hydrochlorothiazide 25 mg daily.  Check labs today

## 2019-02-07 LAB — CBC
HEMATOCRIT: 36.7 % (ref 34.0–46.6)
HEMOGLOBIN: 11.5 g/dL (ref 11.1–15.9)
MCH: 22.7 pg — ABNORMAL LOW (ref 26.6–33.0)
MCHC: 31.3 g/dL — ABNORMAL LOW (ref 31.5–35.7)
MCV: 73 fL — AB (ref 79–97)
Platelets: 401 10*3/uL (ref 150–450)
RBC: 5.06 x10E6/uL (ref 3.77–5.28)
RDW: 15.2 % (ref 11.7–15.4)
WBC: 5.7 10*3/uL (ref 3.4–10.8)

## 2019-02-07 LAB — LIPID PANEL
CHOL/HDL RATIO: 3.5 ratio (ref 0.0–4.4)
Cholesterol, Total: 201 mg/dL — ABNORMAL HIGH (ref 100–199)
HDL: 58 mg/dL (ref >39)
LDL CALC: 122 mg/dL — AB (ref 0–99)
TRIGLYCERIDES: 107 mg/dL (ref 0–149)
VLDL Cholesterol Cal: 21 mg/dL (ref 5–40)

## 2019-02-07 LAB — BASIC METABOLIC PANEL
BUN / CREAT RATIO: 7 — AB (ref 9–23)
BUN: 8 mg/dL (ref 6–24)
CHLORIDE: 105 mmol/L (ref 96–106)
CO2: 24 mmol/L (ref 20–29)
Calcium: 9.1 mg/dL (ref 8.7–10.2)
Creatinine, Ser: 1.16 mg/dL — ABNORMAL HIGH (ref 0.57–1.00)
GFR calc non Af Amer: 54 mL/min/{1.73_m2} — ABNORMAL LOW (ref >59)
GFR, EST AFRICAN AMERICAN: 62 mL/min/{1.73_m2} (ref >59)
GLUCOSE: 86 mg/dL (ref 65–99)
POTASSIUM: 3.4 mmol/L — AB (ref 3.5–5.2)
SODIUM: 140 mmol/L (ref 134–144)

## 2019-02-08 ENCOUNTER — Encounter: Payer: Self-pay | Admitting: Family Medicine

## 2019-02-08 NOTE — Telephone Encounter (Signed)
Patient called and is aware of results.  Danley Danker, RN [facility] RN)

## 2020-05-28 ENCOUNTER — Other Ambulatory Visit: Payer: Self-pay | Admitting: Obstetrics and Gynecology

## 2020-05-29 ENCOUNTER — Other Ambulatory Visit: Payer: Self-pay | Admitting: Obstetrics and Gynecology

## 2020-05-29 DIAGNOSIS — N644 Mastodynia: Secondary | ICD-10-CM

## 2020-05-31 ENCOUNTER — Ambulatory Visit: Payer: Self-pay

## 2020-05-31 ENCOUNTER — Other Ambulatory Visit: Payer: Self-pay

## 2020-05-31 ENCOUNTER — Ambulatory Visit
Admission: RE | Admit: 2020-05-31 | Discharge: 2020-05-31 | Disposition: A | Discharge status: Home / Self Care | Payer: Managed Care, Other (non HMO) | Source: Ambulatory Visit | Attending: Obstetrics and Gynecology | Admitting: Obstetrics and Gynecology

## 2020-05-31 DIAGNOSIS — N644 Mastodynia: Secondary | ICD-10-CM

## 2020-09-25 ENCOUNTER — Other Ambulatory Visit: Payer: Self-pay | Admitting: Obstetrics and Gynecology

## 2020-09-25 DIAGNOSIS — N6310 Unspecified lump in the right breast, unspecified quadrant: Secondary | ICD-10-CM

## 2020-10-15 ENCOUNTER — Ambulatory Visit
Admission: RE | Admit: 2020-10-15 | Discharge: 2020-10-15 | Disposition: A | Discharge status: Home / Self Care | Payer: BC Managed Care – PPO | Source: Ambulatory Visit | Attending: Obstetrics and Gynecology | Admitting: Obstetrics and Gynecology

## 2020-10-15 ENCOUNTER — Other Ambulatory Visit: Payer: Self-pay

## 2020-10-15 ENCOUNTER — Ambulatory Visit
Admission: RE | Admit: 2020-10-15 | Discharge: 2020-10-15 | Disposition: A | Discharge status: Home / Self Care | Payer: Managed Care, Other (non HMO) | Source: Ambulatory Visit | Attending: Obstetrics and Gynecology | Admitting: Obstetrics and Gynecology

## 2020-10-15 DIAGNOSIS — N6310 Unspecified lump in the right breast, unspecified quadrant: Secondary | ICD-10-CM

## 2020-11-21 ENCOUNTER — Other Ambulatory Visit: Payer: Self-pay | Admitting: Obstetrics and Gynecology

## 2020-11-21 DIAGNOSIS — R928 Other abnormal and inconclusive findings on diagnostic imaging of breast: Secondary | ICD-10-CM

## 2021-04-14 ENCOUNTER — Other Ambulatory Visit: Payer: Self-pay

## 2021-04-14 ENCOUNTER — Ambulatory Visit
Admission: RE | Admit: 2021-04-14 | Discharge: 2021-04-14 | Disposition: A | Discharge status: Home / Self Care | Payer: BC Managed Care – PPO | Source: Ambulatory Visit | Attending: Obstetrics and Gynecology | Admitting: Obstetrics and Gynecology

## 2021-04-14 DIAGNOSIS — R928 Other abnormal and inconclusive findings on diagnostic imaging of breast: Secondary | ICD-10-CM
